# Patient Record
Sex: Female | Born: 1955
Health system: Southern US, Community
[De-identification: ages and names within clinical notes are randomized; demographics above are authoritative.]

## PROBLEM LIST (undated history)

## (undated) DIAGNOSIS — Z95818 Presence of other cardiac implants and grafts: Secondary | ICD-10-CM

## (undated) DIAGNOSIS — Z95810 Presence of automatic (implantable) cardiac defibrillator: Secondary | ICD-10-CM

## (undated) DIAGNOSIS — E039 Hypothyroidism, unspecified: Secondary | ICD-10-CM

## (undated) DIAGNOSIS — I428 Other cardiomyopathies: Secondary | ICD-10-CM

## (undated) DIAGNOSIS — E119 Type 2 diabetes mellitus without complications: Secondary | ICD-10-CM

## (undated) DIAGNOSIS — I4729 Other ventricular tachycardia: Secondary | ICD-10-CM

## (undated) DIAGNOSIS — E785 Hyperlipidemia, unspecified: Secondary | ICD-10-CM

## (undated) DIAGNOSIS — I5022 Chronic systolic (congestive) heart failure: Secondary | ICD-10-CM

## (undated) DIAGNOSIS — Q211 Atrial septal defect, unspecified: Secondary | ICD-10-CM

## (undated) DIAGNOSIS — I10 Essential (primary) hypertension: Secondary | ICD-10-CM

## (undated) DIAGNOSIS — G4733 Obstructive sleep apnea (adult) (pediatric): Secondary | ICD-10-CM

## (undated) DIAGNOSIS — I340 Nonrheumatic mitral (valve) insufficiency: Secondary | ICD-10-CM

## (undated) DIAGNOSIS — N183 Chronic kidney disease, stage 3 unspecified: Secondary | ICD-10-CM

## (undated) DIAGNOSIS — E669 Obesity, unspecified: Secondary | ICD-10-CM

## (undated) DIAGNOSIS — Z9889 Other specified postprocedural states: Secondary | ICD-10-CM

## (undated) DIAGNOSIS — I472 Ventricular tachycardia: Secondary | ICD-10-CM

## (undated) HISTORY — PX: OTHER SURGICAL HISTORY: SHX169

## (undated) HISTORY — PX: ICD IMPLANT: EP1208

---

## 2019-01-15 ENCOUNTER — Inpatient Hospital Stay (HOSPITAL_COMMUNITY)
Admission: EM | Admit: 2019-01-15 | Discharge: 2019-01-20 | DRG: 871 | Disposition: A | Discharge status: Home / Self Care | Payer: BLUE CROSS/BLUE SHIELD | Attending: Internal Medicine | Admitting: Internal Medicine

## 2019-01-15 ENCOUNTER — Emergency Department (HOSPITAL_COMMUNITY): Payer: BLUE CROSS/BLUE SHIELD

## 2019-01-15 ENCOUNTER — Encounter (HOSPITAL_COMMUNITY): Payer: Self-pay | Admitting: Pulmonary Disease

## 2019-01-15 DIAGNOSIS — I452 Bifascicular block: Secondary | ICD-10-CM | POA: Diagnosis present

## 2019-01-15 DIAGNOSIS — E870 Hyperosmolality and hypernatremia: Secondary | ICD-10-CM | POA: Diagnosis present

## 2019-01-15 DIAGNOSIS — Y95 Nosocomial condition: Secondary | ICD-10-CM | POA: Diagnosis present

## 2019-01-15 DIAGNOSIS — I472 Ventricular tachycardia: Secondary | ICD-10-CM | POA: Diagnosis present

## 2019-01-15 DIAGNOSIS — I272 Pulmonary hypertension, unspecified: Secondary | ICD-10-CM | POA: Diagnosis present

## 2019-01-15 DIAGNOSIS — I130 Hypertensive heart and chronic kidney disease with heart failure and stage 1 through stage 4 chronic kidney disease, or unspecified chronic kidney disease: Secondary | ICD-10-CM | POA: Diagnosis present

## 2019-01-15 DIAGNOSIS — R0902 Hypoxemia: Secondary | ICD-10-CM

## 2019-01-15 DIAGNOSIS — N179 Acute kidney failure, unspecified: Secondary | ICD-10-CM | POA: Diagnosis present

## 2019-01-15 DIAGNOSIS — I428 Other cardiomyopathies: Secondary | ICD-10-CM | POA: Diagnosis present

## 2019-01-15 DIAGNOSIS — I447 Left bundle-branch block, unspecified: Secondary | ICD-10-CM | POA: Diagnosis present

## 2019-01-15 DIAGNOSIS — R509 Fever, unspecified: Secondary | ICD-10-CM | POA: Diagnosis not present

## 2019-01-15 DIAGNOSIS — Z6829 Body mass index (BMI) 29.0-29.9, adult: Secondary | ICD-10-CM

## 2019-01-15 DIAGNOSIS — I509 Heart failure, unspecified: Secondary | ICD-10-CM

## 2019-01-15 DIAGNOSIS — J9621 Acute and chronic respiratory failure with hypoxia: Secondary | ICD-10-CM

## 2019-01-15 DIAGNOSIS — E876 Hypokalemia: Secondary | ICD-10-CM | POA: Diagnosis present

## 2019-01-15 DIAGNOSIS — A419 Sepsis, unspecified organism: Secondary | ICD-10-CM | POA: Diagnosis not present

## 2019-01-15 DIAGNOSIS — I959 Hypotension, unspecified: Secondary | ICD-10-CM | POA: Diagnosis present

## 2019-01-15 DIAGNOSIS — J189 Pneumonia, unspecified organism: Secondary | ICD-10-CM | POA: Diagnosis present

## 2019-01-15 DIAGNOSIS — Z7982 Long term (current) use of aspirin: Secondary | ICD-10-CM

## 2019-01-15 DIAGNOSIS — Z90710 Acquired absence of both cervix and uterus: Secondary | ICD-10-CM

## 2019-01-15 DIAGNOSIS — E785 Hyperlipidemia, unspecified: Secondary | ICD-10-CM | POA: Diagnosis present

## 2019-01-15 DIAGNOSIS — Z7902 Long term (current) use of antithrombotics/antiplatelets: Secondary | ICD-10-CM

## 2019-01-15 DIAGNOSIS — G9340 Encephalopathy, unspecified: Secondary | ICD-10-CM

## 2019-01-15 DIAGNOSIS — Z9114 Patient's other noncompliance with medication regimen: Secondary | ICD-10-CM

## 2019-01-15 DIAGNOSIS — E039 Hypothyroidism, unspecified: Secondary | ICD-10-CM | POA: Diagnosis present

## 2019-01-15 DIAGNOSIS — J9601 Acute respiratory failure with hypoxia: Secondary | ICD-10-CM | POA: Diagnosis present

## 2019-01-15 DIAGNOSIS — Z95810 Presence of automatic (implantable) cardiac defibrillator: Secondary | ICD-10-CM

## 2019-01-15 DIAGNOSIS — Z1159 Encounter for screening for other viral diseases: Secondary | ICD-10-CM | POA: Diagnosis not present

## 2019-01-15 DIAGNOSIS — N183 Chronic kidney disease, stage 3 unspecified: Secondary | ICD-10-CM

## 2019-01-15 DIAGNOSIS — Z794 Long term (current) use of insulin: Secondary | ICD-10-CM | POA: Diagnosis not present

## 2019-01-15 DIAGNOSIS — Z79899 Other long term (current) drug therapy: Secondary | ICD-10-CM

## 2019-01-15 DIAGNOSIS — I493 Ventricular premature depolarization: Secondary | ICD-10-CM | POA: Diagnosis present

## 2019-01-15 DIAGNOSIS — E1122 Type 2 diabetes mellitus with diabetic chronic kidney disease: Secondary | ICD-10-CM | POA: Diagnosis present

## 2019-01-15 DIAGNOSIS — I083 Combined rheumatic disorders of mitral, aortic and tricuspid valves: Secondary | ICD-10-CM | POA: Diagnosis present

## 2019-01-15 DIAGNOSIS — Z8249 Family history of ischemic heart disease and other diseases of the circulatory system: Secondary | ICD-10-CM

## 2019-01-15 DIAGNOSIS — G4733 Obstructive sleep apnea (adult) (pediatric): Secondary | ICD-10-CM | POA: Diagnosis present

## 2019-01-15 DIAGNOSIS — Q211 Atrial septal defect: Secondary | ICD-10-CM | POA: Diagnosis not present

## 2019-01-15 DIAGNOSIS — I5023 Acute on chronic systolic (congestive) heart failure: Secondary | ICD-10-CM | POA: Diagnosis present

## 2019-01-15 DIAGNOSIS — N184 Chronic kidney disease, stage 4 (severe): Secondary | ICD-10-CM | POA: Diagnosis present

## 2019-01-15 DIAGNOSIS — Z841 Family history of disorders of kidney and ureter: Secondary | ICD-10-CM

## 2019-01-15 DIAGNOSIS — E669 Obesity, unspecified: Secondary | ICD-10-CM | POA: Diagnosis present

## 2019-01-15 DIAGNOSIS — R570 Cardiogenic shock: Secondary | ICD-10-CM | POA: Diagnosis present

## 2019-01-15 DIAGNOSIS — R0602 Shortness of breath: Secondary | ICD-10-CM

## 2019-01-15 DIAGNOSIS — J810 Acute pulmonary edema: Secondary | ICD-10-CM

## 2019-01-15 DIAGNOSIS — Z833 Family history of diabetes mellitus: Secondary | ICD-10-CM

## 2019-01-15 DIAGNOSIS — Z9289 Personal history of other medical treatment: Secondary | ICD-10-CM

## 2019-01-15 DIAGNOSIS — Z6830 Body mass index (BMI) 30.0-30.9, adult: Secondary | ICD-10-CM

## 2019-01-15 DIAGNOSIS — R6521 Severe sepsis with septic shock: Secondary | ICD-10-CM | POA: Diagnosis present

## 2019-01-15 HISTORY — DX: Nonrheumatic mitral (valve) insufficiency: I34.0

## 2019-01-15 HISTORY — DX: Other cardiomyopathies: I42.8

## 2019-01-15 HISTORY — DX: Atrial septal defect, unspecified: Q21.10

## 2019-01-15 HISTORY — DX: Presence of automatic (implantable) cardiac defibrillator: Z95.810

## 2019-01-15 HISTORY — DX: Chronic systolic (congestive) heart failure: I50.22

## 2019-01-15 HISTORY — DX: Other specified postprocedural states: Z98.890

## 2019-01-15 HISTORY — DX: Hypothyroidism, unspecified: E03.9

## 2019-01-15 HISTORY — DX: Essential (primary) hypertension: I10

## 2019-01-15 HISTORY — DX: Obstructive sleep apnea (adult) (pediatric): G47.33

## 2019-01-15 HISTORY — DX: Type 2 diabetes mellitus without complications: E11.9

## 2019-01-15 HISTORY — DX: Ventricular tachycardia: I47.2

## 2019-01-15 HISTORY — DX: Atrial septal defect: Q21.1

## 2019-01-15 HISTORY — DX: Other ventricular tachycardia: I47.29

## 2019-01-15 HISTORY — DX: Chronic kidney disease, stage 3 unspecified: N18.30

## 2019-01-15 HISTORY — DX: Obesity, unspecified: E66.9

## 2019-01-15 HISTORY — DX: Presence of other cardiac implants and grafts: Z95.818

## 2019-01-15 HISTORY — DX: Hyperlipidemia, unspecified: E78.5

## 2019-01-15 LAB — POCT I-STAT 7, (LYTES, BLD GAS, ICA,H+H)
Acid-Base Excess: 2 mmol/L (ref 0.0–2.0)
Bicarbonate: 26.7 mmol/L (ref 20.0–28.0)
Bicarbonate: 28.1 mmol/L — ABNORMAL HIGH (ref 20.0–28.0)
Calcium, Ion: 1.28 mmol/L (ref 1.15–1.40)
Calcium, Ion: 1.28 mmol/L (ref 1.15–1.40)
HCT: 42 % (ref 36.0–46.0)
HCT: 43 % (ref 36.0–46.0)
Hemoglobin: 14.3 g/dL (ref 12.0–15.0)
Hemoglobin: 14.6 g/dL (ref 12.0–15.0)
O2 Saturation: 96 %
O2 Saturation: 99 %
Potassium: 3.7 mmol/L (ref 3.5–5.1)
Potassium: 3.7 mmol/L (ref 3.5–5.1)
Sodium: 147 mmol/L — ABNORMAL HIGH (ref 135–145)
Sodium: 149 mmol/L — ABNORMAL HIGH (ref 135–145)
TCO2: 28 mmol/L (ref 22–32)
TCO2: 30 mmol/L (ref 22–32)
pCO2 arterial: 50.2 mmHg — ABNORMAL HIGH (ref 32.0–48.0)
pCO2 arterial: 46.9 mmHg (ref 32.0–48.0)
pH, Arterial: 7.334 — ABNORMAL LOW (ref 7.350–7.450)
pH, Arterial: 7.386 (ref 7.350–7.450)
pO2, Arterial: 87 mmHg (ref 83.0–108.0)
pO2, Arterial: 163 mmHg — ABNORMAL HIGH (ref 83.0–108.0)

## 2019-01-15 LAB — CBC WITH DIFFERENTIAL/PLATELET
Abs Immature Granulocytes: 0.03 10*3/uL (ref 0.00–0.07)
Basophils Absolute: 0 10*3/uL (ref 0.0–0.1)
Basophils Relative: 0 %
Eosinophils Absolute: 0.2 10*3/uL (ref 0.0–0.5)
Eosinophils Relative: 2 %
HCT: 48 % — ABNORMAL HIGH (ref 36.0–46.0)
Hemoglobin: 13.8 g/dL (ref 12.0–15.0)
Immature Granulocytes: 0 %
Lymphocytes Relative: 36 %
Lymphs Abs: 3 10*3/uL (ref 0.7–4.0)
MCH: 26.4 pg (ref 26.0–34.0)
MCHC: 28.8 g/dL — ABNORMAL LOW (ref 30.0–36.0)
MCV: 92 fL (ref 80.0–100.0)
Monocytes Absolute: 0.5 10*3/uL (ref 0.1–1.0)
Monocytes Relative: 6 %
Neutro Abs: 4.5 10*3/uL (ref 1.7–7.7)
Neutrophils Relative %: 56 %
Platelets: 136 10*3/uL — ABNORMAL LOW (ref 150–400)
RBC: 5.22 MIL/uL — ABNORMAL HIGH (ref 3.87–5.11)
RDW: 17 % — ABNORMAL HIGH (ref 11.5–15.5)
WBC: 8.1 10*3/uL (ref 4.0–10.5)
nRBC: 0 % (ref 0.0–0.2)

## 2019-01-15 LAB — URINALYSIS, ROUTINE W REFLEX MICROSCOPIC
Bilirubin Urine: NEGATIVE
Glucose, UA: 150 mg/dL — AB
Hgb urine dipstick: NEGATIVE
Ketones, ur: NEGATIVE mg/dL
Leukocytes,Ua: NEGATIVE
Nitrite: NEGATIVE
Protein, ur: >=300 mg/dL — AB
Specific Gravity, Urine: 1.018 (ref 1.005–1.030)
pH: 6 (ref 5.0–8.0)

## 2019-01-15 LAB — MAGNESIUM: Magnesium: 2.3 mg/dL (ref 1.7–2.4)

## 2019-01-15 LAB — BASIC METABOLIC PANEL
Anion gap: 19 — ABNORMAL HIGH (ref 5–15)
BUN: 33 mg/dL — ABNORMAL HIGH (ref 8–23)
CO2: 18 mmol/L — ABNORMAL LOW (ref 22–32)
Calcium: 9 mg/dL (ref 8.9–10.3)
Chloride: 108 mmol/L (ref 98–111)
Creatinine, Ser: 2.36 mg/dL — ABNORMAL HIGH (ref 0.44–1.00)
GFR calc Af Amer: 24 mL/min — ABNORMAL LOW (ref >60)
GFR calc non Af Amer: 21 mL/min — ABNORMAL LOW (ref >60)
Glucose, Bld: 279 mg/dL — ABNORMAL HIGH (ref 70–99)
Potassium: 4.6 mmol/L (ref 3.5–5.1)
Sodium: 145 mmol/L (ref 135–145)

## 2019-01-15 LAB — PROCALCITONIN: Procalcitonin: 13.9 ng/mL

## 2019-01-15 LAB — LACTIC ACID, PLASMA
Lactic Acid, Venous: 6.6 mmol/L (ref 0.5–1.9)
Lactic Acid, Venous: 1.2 mmol/L (ref 0.5–1.9)

## 2019-01-15 LAB — GLUCOSE, CAPILLARY
Glucose-Capillary: 101 mg/dL — ABNORMAL HIGH (ref 70–99)
Glucose-Capillary: 116 mg/dL — ABNORMAL HIGH (ref 70–99)
Glucose-Capillary: 116 mg/dL — ABNORMAL HIGH (ref 70–99)
Glucose-Capillary: 80 mg/dL (ref 70–99)
Glucose-Capillary: 97 mg/dL (ref 70–99)

## 2019-01-15 LAB — SARS CORONAVIRUS 2 BY RT PCR (HOSPITAL ORDER, PERFORMED IN ~~LOC~~ HOSPITAL LAB): SARS Coronavirus 2: NEGATIVE

## 2019-01-15 LAB — TRIGLYCERIDES: Triglycerides: 96 mg/dL (ref <150)

## 2019-01-15 LAB — PHOSPHORUS: Phosphorus: 4.5 mg/dL (ref 2.5–4.6)

## 2019-01-15 LAB — COOXEMETRY PANEL
Carboxyhemoglobin: 1.3 % (ref 0.5–1.5)
Methemoglobin: 1.2 % (ref 0.0–1.5)
O2 Saturation: 60.9 %
Total hemoglobin: 13.9 g/dL (ref 12.0–16.0)

## 2019-01-15 LAB — HEMOGLOBIN A1C
Hgb A1c MFr Bld: 5 % (ref 4.8–5.6)
Mean Plasma Glucose: 96.8 mg/dL

## 2019-01-15 LAB — MRSA PCR SCREENING: MRSA by PCR: NEGATIVE

## 2019-01-15 LAB — BRAIN NATRIURETIC PEPTIDE: B Natriuretic Peptide: 595.2 pg/mL — ABNORMAL HIGH (ref 0.0–100.0)

## 2019-01-15 LAB — TROPONIN I (HIGH SENSITIVITY)
Troponin I (High Sensitivity): 18 ng/L — ABNORMAL HIGH (ref <18)
Troponin I (High Sensitivity): 48 ng/L — ABNORMAL HIGH (ref <18)

## 2019-01-15 LAB — TSH: TSH: 0.141 u[IU]/mL — ABNORMAL LOW (ref 0.350–4.500)

## 2019-01-15 MED ORDER — CHLORHEXIDINE GLUCONATE 0.12% ORAL RINSE (MEDLINE KIT)
15.0000 mL | Freq: Two times a day (BID) | OROMUCOSAL | Status: DC
  Administered 2019-01-15 – 2019-01-17 (×4): 15 mL via OROMUCOSAL

## 2019-01-15 MED ORDER — AMIODARONE HCL 200 MG PO TABS: 200.0000 mg | ORAL_TABLET | Freq: Every day | ORAL | Status: DC

## 2019-01-15 MED ORDER — FENTANYL CITRATE (PF) 100 MCG/2ML IJ SOLN
INTRAMUSCULAR | Status: AC
  Filled 2019-01-15: qty 2

## 2019-01-15 MED ORDER — HEPARIN SODIUM (PORCINE) 5000 UNIT/ML IJ SOLN
5000.0000 [IU] | Freq: Three times a day (TID) | INTRAMUSCULAR | Status: DC
  Administered 2019-01-15 – 2019-01-20 (×14): 5000 [IU] via SUBCUTANEOUS
  Filled 2019-01-15 (×14): qty 1

## 2019-01-15 MED ORDER — NOREPINEPHRINE 16 MG/250ML-% IV SOLN
2.0000 ug/min | INTRAVENOUS | Status: DC
  Administered 2019-01-15: 10 ug/min via INTRAVENOUS
  Filled 2019-01-15: qty 250

## 2019-01-15 MED ORDER — ORAL CARE MOUTH RINSE
15.0000 mL | OROMUCOSAL | Status: DC
  Administered 2019-01-15 – 2019-01-17 (×15): 15 mL via OROMUCOSAL

## 2019-01-15 MED ORDER — FUROSEMIDE 10 MG/ML IJ SOLN
120.0000 mg | Freq: Two times a day (BID) | INTRAVENOUS | Status: DC
  Administered 2019-01-15: 120 mg via INTRAVENOUS
  Filled 2019-01-15 (×3): qty 12
  Filled 2019-01-15: qty 10
  Filled 2019-01-15 (×3): qty 12

## 2019-01-15 MED ORDER — DOBUTAMINE IN D5W 4-5 MG/ML-% IV SOLN
2.5000 ug/kg/min | INTRAVENOUS | Status: DC
  Administered 2019-01-15: 2.5 ug/kg/min via INTRAVENOUS
  Filled 2019-01-15: qty 250

## 2019-01-15 MED ORDER — FENTANYL CITRATE (PF) 100 MCG/2ML IJ SOLN
50.0000 ug | INTRAMUSCULAR | Status: DC | PRN
  Administered 2019-01-15: 50 ug via INTRAVENOUS

## 2019-01-15 MED ORDER — AMIODARONE HCL 200 MG PO TABS
200.0000 mg | ORAL_TABLET | Freq: Every day | ORAL | Status: DC
  Administered 2019-01-15 – 2019-01-17 (×3): 200 mg
  Filled 2019-01-15 (×3): qty 1

## 2019-01-15 MED ORDER — ASPIRIN 81 MG PO CHEW
81.0000 mg | CHEWABLE_TABLET | Freq: Every day | ORAL | Status: DC
  Administered 2019-01-15 – 2019-01-17 (×3): 81 mg
  Filled 2019-01-15 (×3): qty 1

## 2019-01-15 MED ORDER — LEVOTHYROXINE SODIUM 100 MCG/5ML IV SOLN
50.0000 ug | Freq: Every day | INTRAVENOUS | Status: DC
  Administered 2019-01-16 – 2019-01-17 (×2): 50 ug via INTRAVENOUS
  Filled 2019-01-15 (×2): qty 5

## 2019-01-15 MED ORDER — FENTANYL CITRATE (PF) 100 MCG/2ML IJ SOLN
INTRAMUSCULAR | Status: AC | PRN
  Administered 2019-01-15: 50 ug via INTRAVENOUS

## 2019-01-15 MED ORDER — MILRINONE LACTATE IN DEXTROSE 20-5 MG/100ML-% IV SOLN
0.2500 ug/kg/min | INTRAVENOUS | Status: DC
  Administered 2019-01-15 (×2): 0.25 ug/kg/min via INTRAVENOUS
  Filled 2019-01-15 (×2): qty 100

## 2019-01-15 MED ORDER — VANCOMYCIN HCL 10 G IV SOLR
1250.0000 mg | INTRAVENOUS | Status: DC
  Administered 2019-01-16 – 2019-01-17 (×3): 1250 mg via INTRAVENOUS
  Filled 2019-01-15 (×3): qty 1250

## 2019-01-15 MED ORDER — SUCCINYLCHOLINE CHLORIDE 20 MG/ML IJ SOLN
INTRAMUSCULAR | Status: AC | PRN
  Administered 2019-01-15: 100 mg via INTRAVENOUS

## 2019-01-15 MED ORDER — CLOPIDOGREL BISULFATE 75 MG PO TABS
75.0000 mg | ORAL_TABLET | Freq: Every day | ORAL | Status: DC
  Administered 2019-01-15 – 2019-01-17 (×3): 75 mg
  Filled 2019-01-15 (×3): qty 1

## 2019-01-15 MED ORDER — INSULIN ASPART 100 UNIT/ML ~~LOC~~ SOLN
0.0000 [IU] | SUBCUTANEOUS | Status: DC
  Administered 2019-01-16 – 2019-01-17 (×3): 2 [IU] via SUBCUTANEOUS

## 2019-01-15 MED ORDER — FUROSEMIDE 10 MG/ML IJ SOLN
120.0000 mg | Freq: Two times a day (BID) | INTRAVENOUS | Status: DC
  Administered 2019-01-16: 120 mg via INTRAVENOUS
  Filled 2019-01-15 (×3): qty 12

## 2019-01-15 MED ORDER — FENTANYL CITRATE (PF) 100 MCG/2ML IJ SOLN
50.0000 ug | INTRAMUSCULAR | Status: DC | PRN
  Filled 2019-01-15: qty 2

## 2019-01-15 MED ORDER — HYDRALAZINE HCL 20 MG/ML IJ SOLN: 10.0000 mg | INTRAMUSCULAR | Status: DC | PRN

## 2019-01-15 MED ORDER — ETOMIDATE 2 MG/ML IV SOLN
INTRAVENOUS | Status: AC | PRN
  Administered 2019-01-15: 20 mg via INTRAVENOUS

## 2019-01-15 MED ORDER — SODIUM CHLORIDE 0.9 % IV SOLN
2.0000 g | INTRAVENOUS | Status: DC
  Administered 2019-01-15 – 2019-01-19 (×4): 2 g via INTRAVENOUS
  Filled 2019-01-15 (×5): qty 2

## 2019-01-15 MED ORDER — PROPOFOL 1000 MG/100ML IV EMUL
0.0000 ug/kg/min | INTRAVENOUS | Status: DC
  Administered 2019-01-15: 20 ug/kg/min via INTRAVENOUS
  Administered 2019-01-15: 5 ug/kg/min via INTRAVENOUS
  Filled 2019-01-15: qty 100

## 2019-01-15 MED ORDER — PANTOPRAZOLE SODIUM 40 MG IV SOLR
40.0000 mg | Freq: Every day | INTRAVENOUS | Status: DC
  Administered 2019-01-15 – 2019-01-16 (×2): 40 mg via INTRAVENOUS
  Filled 2019-01-15 (×2): qty 40

## 2019-01-15 MED ORDER — PROPOFOL 1000 MG/100ML IV EMUL
INTRAVENOUS | Status: AC
  Filled 2019-01-15: qty 100

## 2019-01-15 MED ORDER — ACETAMINOPHEN 160 MG/5ML PO SOLN
650.0000 mg | Freq: Four times a day (QID) | ORAL | Status: DC | PRN
  Administered 2019-01-15 – 2019-01-17 (×5): 650 mg
  Filled 2019-01-15 (×5): qty 20.3

## 2019-01-15 MED ORDER — NOREPINEPHRINE 4 MG/250ML-% IV SOLN
2.0000 ug/min | INTRAVENOUS | Status: DC
  Administered 2019-01-15: 17:00:00 4 ug/min via INTRAVENOUS

## 2019-01-15 NOTE — ED Notes (Signed)
Attempted to collect labs without success  

## 2019-01-15 NOTE — ED Notes (Signed)
X-ray at bedside

## 2019-01-15 NOTE — Progress Notes (Signed)
PCCM INTERVAL PROGRESS NOTE  Called for new fever.  Temp 102.5.  WBC wnl No historical complaints of infectious symptoms.   Plan: Start PRN tylenol for fevers Pan culture Low threshold to start ABX if fever remains at next temp check.     Georgann Housekeeper, AGACNP-BC [place] Pager [phone] or [phone]  01/15/2019 6:31 PM

## 2019-01-15 NOTE — Progress Notes (Signed)
  Remains on vent. Awake.   Earlier this evening spiked temp to 39.9. Seen by CCM and started tylenol and cx drawn ABx not started.   BP has dropped subsequently and dobutamine added. Now on milrinone 0.25, dobutamine 2.5 and NE 16.   SBP 100-105. Temp now 38.4 with ice packs and tylenol.   Co-ox 61% Lactate 6.6 -> 1.2  WBC on admit 8.1 with no left shift.   CVP measured personally and although tracing not great seems to consistently read 29-30.  Will give IV lasix. Stop milrinone. Continue dobutamine and NE.   Check stat PCT. If PCT elevated or has recurrent fever will start broad spectrum abx.   40 mins additional CCT  Glori Bickers, MD  9:58 PM

## 2019-01-15 NOTE — ED Notes (Signed)
ED TO INPATIENT HANDOFF REPORT  ED Nurse Name and Phone #:  Rommel Hogston [phone]  S Name/Age/Gender Veronica Burke 63 y.o. female Room/Bed: TRAAC/TRAAC  Code Status   Code Status: Full Code  Home/SNF/Other Home Patient oriented to: self, place, time and situation Is this baseline? Yes   Triage Complete: Triage complete  Chief Complaint Resp Distress  Triage Note Pt arrives via EMS for resp distress. Pt riding in car back from [place], feeling progressively SOB. EMS arrived and pt had bilateral rales. When they had pt stand, pt became unresponsive, brady in the 40's. EMS started bagging pt for 5-6 min. HR improved, pt placed on CPAP and remained until arrival at ED. Pt had PICC line with CHF medication in right arm. BP 132/110, HR 100, spo2 85% per EMS   Allergies No Known Allergies  Level of Care/Admitting Diagnosis ED Disposition    ED Disposition Condition [place]: [facility] [[id number]]  Level of Care: ICU [6]  Covid Evaluation: Confirmed COVID Negative  Diagnosis: Acute on chronic HFrEF (heart failure with reduced ejection fraction) [facility]) [[id number]]  Admitting Physician: Lady Saucier  Attending Physician: Rush Farmer [phone]  Estimated length of stay: 5 - 7 days  Certification:: I certify this patient will need inpatient services for at least 2 midnights  PT Class (Do Not Modify): Inpatient [101]  PT Acc Code (Do Not Modify): Private [1]       B Medical/Surgery History Past Medical History:  Diagnosis Date  . CHF (congestive heart failure) (HCC)    home milirnone infusion  . CKD (chronic kidney disease)   . DM (diabetes mellitus) ([place])   . Dyslipidemia   . Hypothyroid   . Mitral regurgitation    s/p clip 12/2018  . OSA (obstructive sleep apnea)    on CPAP   History reviewed. No pertinent surgical history.   A IV Location/Drains/Wounds Patient Lines/Drains/Airways Status   Active Line/Drains/Airways     Name:   Placement date:   Placement time:   Site:   Days:   Peripheral IV 01/15/19 Left Wrist   01/15/19    1007    Wrist   less than 1   Peripheral IV 01/15/19 Left Antecubital   01/15/19    1023    Antecubital   less than 1   NG/OG Tube Orogastric 18 Fr. Center mouth Xray;Aucultation   01/15/19    1012    Center mouth   less than 1   Urethral Catheter Hope RN Temperature probe;Straight-tip 14 Fr.   01/15/19    1045    Temperature probe;Straight-tip   less than 1   Airway 7.5 mm   01/15/19    1020     less than 1          Intake/Output Last 24 hours No intake or output data in the 24 hours ending 01/15/19 1300  Labs/Imaging Results for orders placed or performed during the hospital encounter of 01/15/19 (from the past 48 hour(s))  SARS Coronavirus 2 (CEPHEID - Performed in [facility] hospital lab), Hosp Order     Status: None   Collection Time: 01/15/19 10:06 AM   Specimen: Nasopharyngeal Swab  Result Value Ref Range   SARS Coronavirus 2 NEGATIVE NEGATIVE    Comment: (NOTE) If result is NEGATIVE SARS-CoV-2 target nucleic acids are NOT DETECTED. The SARS-CoV-2 RNA is generally detectable in upper and lower  respiratory specimens during the acute phase of infection.  The lowest  concentration of SARS-CoV-2 viral copies this assay can detect is 250  copies / mL. A negative result does not preclude SARS-CoV-2 infection  and should not be used as the sole basis for treatment or other  patient management decisions.  A negative result may occur with  improper specimen collection / handling, submission of specimen other  than nasopharyngeal swab, presence of viral mutation(s) within the  areas targeted by this assay, and inadequate number of viral copies  (<250 copies / mL). A negative result must be combined with clinical  observations, patient history, and epidemiological information. If result is POSITIVE SARS-CoV-2 target nucleic acids are DETECTED. The SARS-CoV-2 RNA is generally  detectable in upper and lower  respiratory specimens dur ing the acute phase of infection.  Positive  results are indicative of active infection with SARS-CoV-2.  Clinical  correlation with patient history and other diagnostic information is  necessary to determine patient infection status.  Positive results do  not rule out bacterial infection or co-infection with other viruses. If result is PRESUMPTIVE POSTIVE SARS-CoV-2 nucleic acids MAY BE PRESENT.   A presumptive positive result was obtained on the submitted specimen  and confirmed on repeat testing.  While 2019 novel coronavirus  (SARS-CoV-2) nucleic acids may be present in the submitted sample  additional confirmatory testing may be necessary for epidemiological  and / or clinical management purposes  to differentiate between  SARS-CoV-2 and other Sarbecovirus currently known to infect humans.  If clinically indicated additional testing with an alternate test  methodology [phone]) is advised. The SARS-CoV-2 RNA is generally  detectable in upper and lower respiratory sp ecimens during the acute  phase of infection. The expected result is Negative. Fact Sheet for Patients:  StrictlyIdeas.no Fact Sheet for Healthcare Providers: BankingDealers.co.za This test is not yet approved or cleared by the [place] FDA and has been authorized for detection and/or diagnosis of SARS-CoV-2 by FDA under an Emergency Use Authorization (EUA).  This EUA will remain in effect (meaning this test can be used) for the duration of the COVID-19 declaration under Section 564(b)(1) of the Act, 21 U.S.C. section 360bbb-3(b)(1), unless the authorization is terminated or revoked sooner. Performed at [facility], [place] [address], [place], [place] [address]   Basic metabolic panel     Status: Abnormal   Collection Time: 01/15/19 10:13 AM  Result Value Ref Range   Sodium 145 135 - 145 mmol/L    Potassium 4.6 3.5 - 5.1 mmol/L    Comment: SLIGHT HEMOLYSIS   Chloride 108 98 - 111 mmol/L   CO2 18 (L) 22 - 32 mmol/L   Glucose, Bld 279 (H) 70 - 99 mg/dL   BUN 33 (H) 8 - 23 mg/dL   Creatinine, Ser 2.36 (H) 0.44 - 1.00 mg/dL   Calcium 9.0 8.9 - 10.3 mg/dL   GFR calc non Af Amer 21 (L) >60 mL/min   GFR calc Af Amer 24 (L) >60 mL/min   Anion gap 19 (H) 5 - 15    Comment: Performed at [facility], [place] [address], [place], [place] [address]  CBC with Differential     Status: Abnormal   Collection Time: 01/15/19 10:13 AM  Result Value Ref Range   WBC 8.1 4.0 - 10.5 K/uL   RBC 5.22 (H) 3.87 - 5.11 MIL/uL   Hemoglobin 13.8 12.0 - 15.0 g/dL   HCT 48.0 (H) 36.0 - 46.0 %   MCV 92.0 80.0 - 100.0 fL  MCH 26.4 26.0 - 34.0 pg   MCHC 28.8 (L) 30.0 - 36.0 g/dL   RDW 17.0 (H) 11.5 - 15.5 %   Platelets 136 (L) 150 - 400 K/uL   nRBC 0.0 0.0 - 0.2 %   Neutrophils Relative % 56 %   Neutro Abs 4.5 1.7 - 7.7 K/uL   Lymphocytes Relative 36 %   Lymphs Abs 3.0 0.7 - 4.0 K/uL   Monocytes Relative 6 %   Monocytes Absolute 0.5 0.1 - 1.0 K/uL   Eosinophils Relative 2 %   Eosinophils Absolute 0.2 0.0 - 0.5 K/uL   Basophils Relative 0 %   Basophils Absolute 0.0 0.0 - 0.1 K/uL   Immature Granulocytes 0 %   Abs Immature Granulocytes 0.03 0.00 - 0.07 K/uL    Comment: Performed at [facility] [address] [place], [place] [address]  Brain natriuretic peptide     Status: Abnormal   Collection Time: 01/15/19 10:13 AM  Result Value Ref Range   B Natriuretic Peptide 595.2 (H) 0.0 - 100.0 pg/mL    Comment: Performed at [facility] [address] [place], [place] [address]  Troponin I (High Sensitivity)     Status: Abnormal   Collection Time: 01/15/19 10:13 AM  Result Value Ref Range   Troponin I (High Sensitivity) 18 (H) <18 ng/L    Comment: (NOTE) Elevated high sensitivity troponin I (hsTnI) values and significant  changes across serial measurements may suggest ACS but many  other  chronic and acute conditions are known to elevate hsTnI results.  Refer to the Links section for chest pain algorithms and additional  guidance. Performed at [facility], [place] [address], [place], [place] [address]   Lactic acid, plasma     Status: Abnormal   Collection Time: 01/15/19 10:14 AM  Result Value Ref Range   Lactic Acid, Venous 6.6 (HH) 0.5 - 1.9 mmol/L    Comment: CRITICAL RESULT CALLED TO, READ BACK BY AND VERIFIED WITH: H.DOOLEY,RN 1047 01/15/2019 CLARK,S Performed at [facility], [place] [address] [place], [place] [address]   Triglycerides     Status: None   Collection Time: 01/15/19 10:14 AM  Result Value Ref Range   Triglycerides 96 <150 mg/dL    Comment: SLIGHT HEMOLYSIS Performed at [facility] [address], [place], [place] [address]   I-STAT 7, (LYTES, BLD GAS, ICA, H+H)     Status: Abnormal   Collection Time: 01/15/19 11:34 AM  Result Value Ref Range   pH, Arterial 7.334 (L) 7.350 - 7.450   pCO2 arterial 50.2 (H) 32.0 - 48.0 mmHg   pO2, Arterial 87.0 83.0 - 108.0 mmHg   Bicarbonate 26.7 20.0 - 28.0 mmol/L   TCO2 28 22 - 32 mmol/L   O2 Saturation 96.0 %   Sodium 147 (H) 135 - 145 mmol/L   Potassium 3.7 3.5 - 5.1 mmol/L   Calcium, Ion 1.28 1.15 - 1.40 mmol/L   HCT 42.0 36.0 - 46.0 %   Hemoglobin 14.3 12.0 - 15.0 g/dL   Patient temperature HIDE    Sample type ARTERIAL    Dg Chest Port 1 View  Result Date: 01/15/2019 CLINICAL DATA:  Endotracheal and orogastric tube placement. EXAM: PORTABLE CHEST 1 VIEW COMPARISON:  Chest CT June 29, 2018 FINDINGS: Endotracheal tube terminates 4 cm above the carina. Enteric catheter transverses the thorax and descends into the expected location of gastric body, tip collimated off the image. Single lead  cardiac pacemaker in place. The cardiac silhouette is enlarged. Mediastinal contours appear intact. Bilateral patchy alveolar and interstitial opacities with central predominance. No evidence of  pneumothorax. Osseous structures are without acute abnormality. Soft tissues are grossly normal. IMPRESSION: 1. Support apparatus as described. 2. Bilateral patchy alveolar and interstitial opacities with central predominance. This may represent mixed pattern pulmonary edema or bilateral airspace consolidation. 3. Enlarged cardiac silhouette. Electronically Signed   By: Fidela Salisbury M.D.   On: 01/15/2019 10:40    Pending Labs Unresulted Labs (From admission, onward)    Start     Ordered   01/16/19 0500  CBC  Tomorrow morning,   R     01/15/19 1244   01/16/19 1941  Basic metabolic panel  Tomorrow morning,   R     01/15/19 1244   01/16/19 0500  Magnesium  Tomorrow morning,   R     01/15/19 1244   01/16/19 0500  Phosphorus  Tomorrow morning,   R     01/15/19 1244   01/16/19 0500  Blood gas, arterial  Tomorrow morning,   R     01/15/19 1244   01/16/19 0500  CBC  Tomorrow morning,   R     01/15/19 1252   01/16/19 7408  Basic metabolic panel  Tomorrow morning,   R     01/15/19 1252   01/16/19 0500  Magnesium  Tomorrow morning,   R     01/15/19 1252   01/16/19 0500  Phosphorus  Tomorrow morning,   R     01/15/19 1252   01/16/19 0500  Blood gas, arterial  Tomorrow morning,   R     01/15/19 1252   01/15/19 1251  Magnesium  Once,   STAT     01/15/19 1252   01/15/19 1251  Phosphorus  Once,   STAT     01/15/19 1252   01/15/19 1251  Blood gas, arterial  Once,   R     01/15/19 1252   01/15/19 1245  Hemoglobin A1c  Once,   STAT    Comments: To assess prior glycemic control    01/15/19 1245   01/15/19 1242  HIV antibody (Routine Testing)  Once,   STAT     01/15/19 1244   01/15/19 1045  Urinalysis, Routine w reflex microscopic  Once,   STAT     01/15/19 1044   01/15/19 1014  Culture, blood (routine x 2)  BLOOD CULTURE X 2,   STAT     01/15/19 1013   01/15/19 1014  Lactic acid, plasma  Now then every 2 hours,   STAT     01/15/19 1013   01/15/19 1014  Triglycerides  (propofol  (DIPRIVAN))  Every 72 hours,   R    Comments: While on propofol (DIPRIVAN)    01/15/19 1014          Vitals/Pain Today's Vitals   01/15/19 1130 01/15/19 1145 01/15/19 1215 01/15/19 1230  BP: (!) 77/63 (!) 86/63 109/70 114/83  Pulse: 70     Resp: 11 16 18 18   Temp:      TempSrc:      SpO2: 100%     Weight:      Height:        Isolation Precautions No active isolations  Medications Medications  propofol (DIPRIVAN) 1000 MG/100ML infusion (10 mcg/kg/min  100 kg Intravenous Rate/Dose Change 01/15/19 1058)  fentaNYL (SUBLIMAZE) injection 50 mcg (has no administration in time range)  fentaNYL (  SUBLIMAZE) injection 50 mcg (has no administration in time range)  fentaNYL (SUBLIMAZE) 100 MCG/2ML injection (has no administration in time range)  propofol (DIPRIVAN) 1000 MG/100ML infusion (has no administration in time range)  fentaNYL (SUBLIMAZE) 100 MCG/2ML injection (has no administration in time range)  levothyroxine (SYNTHROID, LEVOTHROID) injection 50 mcg (has no administration in time range)  heparin injection 5,000 Units (has no administration in time range)  pantoprazole (PROTONIX) injection 40 mg (has no administration in time range)  insulin aspart (novoLOG) injection 0-15 Units (has no administration in time range)  amiodarone (PACERONE) tablet 200 mg (has no administration in time range)  etomidate (AMIDATE) injection (20 mg Intravenous Given 01/15/19 1008)  succinylcholine (ANECTINE) injection (100 mg Intravenous Given 01/15/19 1008)  fentaNYL (SUBLIMAZE) injection (50 mcg Intravenous Given 01/15/19 1027)  fentaNYL (SUBLIMAZE) injection (50 mcg Intravenous Given 01/15/19 1104)    Mobility walks     Focused Assessments Pulmonary Assessment Handoff:  Lung sounds: Bilateral Breath Sounds: Rales O2 Device: NRB        R Recommendations: See Admitting Provider Note  Report given to:   Additional Notes:

## 2019-01-15 NOTE — ED Notes (Signed)
[phone] Veronica Burke, Daughter

## 2019-01-15 NOTE — ED Provider Notes (Addendum)
[facility] EMERGENCY DEPARTMENT Provider Note   CSN: [id number] Arrival date & time: 01/15/19  1001    History   Chief Complaint Chief Complaint  Patient presents with  . Respiratory Distress    HPI Veronica Burke is a 63 y.o. female.     Pt presents to the ED today in severe resp distress.  The pt has a hx of cardiomyopathy and CHF.  Her last Echo showed an EF of 20-25%.  She is on a constant infusion of milrinone.  She is not a candidate for VAD or transplant due to a combination of CKD, obesity, and support system.  She had a mitra-clip placed on 12/06/18   Last ECHO (7/2):  Transthoracic Echocardiogram Report    Name: Veronica Burke  Study Date: 01/04/2019  Height: 61 in  MRN: [id number]  Weight: 187 lb  DOB: 1955-12-24  Gender: Female  BSA: 1.8 m2  Age: 62 yrsEthnicity: Black  BP: 153/79 mmHg  Reason For Study: Mitral valve insufficiency, unspecified etiology  HR: 75    Ordering Physician: [id number] [facility], DAVID  Performed By: Geoffery Lyons  Fellow: Blenda Bridegroom  Referring Physician: ZHAO, DAVID [place]  Image Quality: Technically adequate.  -  SUMMARY  The left ventricle is moderately dilated.  There is normal left ventricular wall thickness.    Left ventricular systolic function is severely reduced.  LV ejection fraction = 20-25%.  There is severe global hypokinesis of the left ventricle.  The right ventricle is mildly dilated.  The right ventricular systolic function is normal.  Device lead in the right ventricle  The left atrium is moderately dilated.  Mild ASD by color flow doppler. There is L-R interatrial shunt.  There is a single MitraClip.  There is moderate posteriorly directed mitral regurgitation.  The mean gradient across the mitral valve is 4.1 mmHg.  HR 63 bpm  There is mild aortic regurgitation.  There is moderate tricuspid regurgitation.  Moderate pulmonary  hypertension.  Estimated right ventricular systolic pressure is 51 mmHg.  Dilated IVC consistent with elevated RA pressure.  There is no pericardial effusion.  As compared to prior study, the MR has worsened, there is evidence of   increased R sided pressures and an ASD with L-R shunting is more evident.  -  FINDINGS:    LEFT VENTRICLE  The left ventricle is moderately dilated. There is normal left ventricular   wall thickness. Left ventricular systolic function is severely reduced. LV   ejection fraction = 20-25%. Left ventricular filling pattern is prolonged   relaxation. There is severe global hypokinesis of the left ventricle.   Abnormal (paradoxical) septal motion consistent with LBBB.  -    RIGHT VENTRICLE  The right ventricle is mildly dilated. Device lead in the right ventricle.   The right ventricular systolic function is normal.    LEFT ATRIUM  The left atrium is moderately dilated.    RIGHT ATRIUM    Right atrial size is normal. Mild ASD by color flow doppler. There is L-R   interatrial shunt.  -  AORTIC VALVE  The aortic valve is trileaflet. There is aortic valve sclerosis. The aortic   valve opens well. There is no aortic stenosis. There is mild aortic   regurgitation.  -  MITRAL VALVE  There is moderate mitral regurgitation. The regurgitant jet is   posteriorly-directed. The mean gradient across the mitral valve is 4.1 mmHg.   HR 63  bpm. There is a single MitraClip of the A3-P3 leaflets.  -  TRICUSPID VALVE  The tricuspid valve is normal in structure and function. There is moderate   tricuspid regurgitation. Estimated right ventricular systolic pressure is 51   mmHg. Moderate pulmonary hypertension.  -  PULMONIC VALVE  Structurally normal pulmonic valve. Mild pulmonic valvular regurgitation.  -  ARTERIES  The aortic sinus is normal size.  -  VENOUS  Pulmonary venous flow pattern not well visualized.  The inferior vena cava is   mildly dilated with a decrease in inspiratory collapse. Dilated IVC   consistent with elevated RA pressure.  -  EFFUSION  There is no pericardial effusion.  -  -  MMode/2D Measurements & Calculations  IVSd: 0.91 cm  LVIDd: 5.7 cm  LVPWd: 1.00 cm  LVIDs: 5.3 cm  LA diam: 3.8 cm  EDV(MOD-sp4): 148.7 ml  ESV(MOD-sp4): 112.0 ml  EDV(MOD-sp2): 137.9 ml  ESV(MOD-sp2): 104.2 ml  Ao sinus diam: 2.6 cm  asc Aorta Diam: 3.4 cm  LVOT diam: 2.0 cm  SV(MOD-sp4): 36.7 ml  SI(MOD-sp4): 20.0 ml/m2  IVC 1: 2.2 cm  LA area A2: 20.8 cm2  LA area A4: 29.0 cm2  LA vol: 76.9 ml  LA vol index: 41.9 ml/m2  RA area A4: 17.2 cm2  Doppler Measurements & Calculations  MV E max vel: 166.1 cm/sec  MV A max vel: 109.1 cm/sec  MV E/A: 1.5  Med Peak E' Vel: 3.5 cm/sec  Lat Peak E' Vel: 6.7 cm/sec  E/Lat E`: 24.8  E/Med E`: 46.8  MV max PG: 11.8 mmHg  MV mean PG: 4.1 mmHg  MVA(P1/2t): 3.2 cm2  MVA(VTI): 2.0 cm2  MV P1/2t max vel: 172.6 cm/sec  MV P1/2t: 68.4 msec  MV dec time: 0.17 sec  SV(LVOT): 85.2 ml  Ao V2 max: 139.7 cm/sec  Ao max PG: 7.8 mmHg  Ao V2 mean: 91.9 cm/sec  Ao mean PG: 3.8 mmHg  Ao V2 VTI: 28.1 cm  AVA (VTI): 3.0 cm2  AI max vel: 407.2 cm/sec  AI dec slope: 234.3 cm/sec2  AI P1/2t: 509.0 msec  LV V1 VTI: 27.3 cm  MR ERO: 0.17 cm2  MR PISA radius: 0.70 cm  MR alias vel: 26.0 cm/sec  TR max vel: 302.3 cm/sec  TR max PG: 36.6 mmHg  RVSP(TR): 51.6 mmHg  RAP systole: 15.0 mmHg  AS Dimensionless Index (VTI): 0.97  AVAi(VTI) cm^2/m^2: 1.7 cm2  SV index(LVOT): 46.4 ml/m2      Reading Physician:  MD Dorothe Pea, [phone] 01/04/2019 11:04 AM  [facility]   CUS TTE SURFACE COMPLETE ECHO ADULT (01/04/2019 8:46 AM EDT) Procedure Note Interface, External Ris In - 01/04/2019 11:09 AM EDT  [place], [place]. [address]  Transthoracic Echocardiogram Report  Name: Veronica Burke Study Date: 01/04/2019 Height: 61 in MRN: [id number] Weight: 187 lb DOB: [birth year] Gender: Female BSA: 1.8 m2 Age: 42 yrsEthnicity: Black BP: 153/79 mmHg Reason For Study: Mitral valve insufficiency, unspecified etiology HR: 75  Ordering Physician: [id number] [facility], DAVID Performed By: Geoffery Lyons Fellow: Blenda Bridegroom Referring Physician: Maylon Peppers, DAVID XIAO-MING  Pt is unable to give any hx.  Per EMS, she was driving back from [place] and felt progressively sob.  When EMS arrived, she had bilateral rales.  She became unresponsive, and hr went down to the 40s, and she became apneic.  EMS did bag her for about 5-6 minutes.  They put her on cpap for transport.  RA O2 sats  in the upper 60s.  Pt is minimally responsive upon arrival.     No past medical history on file.  There are no active problems to display for this patient.    PAST MEDICAL, SOCIAL AND FAMILY HISTORY:  Past Medical History:  Past Medical History:  Diagnosis Date  . Arthritis  . Cardiomyopathy ([place])  . CHF (congestive heart failure) ([place])  . Chronic kidney disease  . Diabetes mellitus ([place])  . Hyperlipidemia  . Hypertension  . OSA on CPAP  . Sleep apnea with use of continuous positive airway pressure (CPAP)  . Thyroid disease   Past Surgical History:  Past Surgical History:  Procedure Laterality Date  . CARDIAC DEFIBRILLATOR PLACEMENT  . CARDIAC DEFIBRILLATOR PLACEMENT  . CESAREAN SECTION  . HYSTERECTOMY  . HYSTERECTOMY  . MOUTH SURGERY      S/P mitral valve clip implantation     OB History   No obstetric history on file.    Family History  Problem Relation Age of Onset  . Heart disease Mother  . Hypertension Mother  . Diabetes Mother  . Kidney disease Mother  . Cancer Father  . Hypertension Sister  . Heart disease Sister   Social History   Tobacco Use  . Smoking status: Never Smoker  . Smokeless tobacco: Never Used   Substance Use Topics  . Alcohol use: No  . Drug use: No    Home Medications    Prior to Admission medications   Medication Sig Start Date End Date Taking? Authorizing Provider  amiodarone (PACERONE) 200 MG tablet Take 200 mg by mouth daily. 12/18/18   [provider]  atorvastatin (LIPITOR) 40 MG tablet Take 40 mg by mouth daily. 12/13/18   [provider]  carvedilol (COREG) 6.25 MG tablet Take 6.25 mg by mouth 2 (two) times a day. 11/20/18   [provider]  clopidogrel (PLAVIX) 75 MG tablet Take 75 mg by mouth daily. 12/07/18   [provider]  FERREX 150 150 MG capsule Take 150 mg by mouth every Monday, Wednesday, and Friday. 01/04/19   [provider]  hydrALAZINE (APRESOLINE) 25 MG tablet Take 50 mg by mouth 3 (three) times daily. 01/04/19   [provider]  isosorbide dinitrate (ISORDIL) 20 MG tablet Take 20 mg by mouth 3 (three) times daily. 12/22/18   [provider]  JANUVIA 50 MG tablet Take 50 mg by mouth daily. 12/21/18   [provider]  LEVEMIR FLEXTOUCH 100 UNIT/ML Pen Inject 15 Units into the skin at bedtime. 12/13/18   [provider]  levothyroxine (SYNTHROID) 100 MCG tablet Take 100 mcg by mouth daily before breakfast. 12/22/18   [provider]  magnesium oxide (MAG-OX) 400 MG tablet Take 1 tablet by mouth 2 (two) times daily. 12/08/18   [provider]  torsemide (DEMADEX) 20 MG tablet Take 20 mg by mouth daily. 12/18/18   [provider]  VICTOZA 18 MG/3ML SOPN Inject 1.8 mg into the skin daily. 01/04/19   [provider]   Drug(s): Milrinone CIVI 3.71ml/hr 10.63mcg/min   Family History No family history on file.  Social History Social History   Tobacco Use  . Smoking status: Not on file  Substance Use Topics  . Alcohol use: Not on file  . Drug use: Not on file     Allergies   Patient has no known allergies.   Review of Systems Review of Systems   Unable to perform ROS: Severe respiratory distress  Physical Exam Updated Vital Signs BP (!) 86/63   Pulse 70   Temp (!) 96.7 F (35.9 C) (Axillary)   Resp 16   Ht 5\' 5"  (1.651 m)   Wt 100 kg   SpO2 100%   BMI 36.69 kg/m   Physical Exam Vitals signs and nursing note reviewed.  Constitutional:      General: She is in acute distress.     Appearance: She is ill-appearing.  HENT:     Head: Normocephalic and atraumatic.     Right Ear: External ear normal.     Left Ear: External ear normal.     Nose: Nose normal.     Mouth/Throat:     Mouth: Mucous membranes are dry.  Eyes:     Extraocular Movements: Extraocular movements intact.     Conjunctiva/sclera: Conjunctivae normal.     Pupils: Pupils are equal, round, and reactive to light.  Neck:     Musculoskeletal: Normal range of motion.  Cardiovascular:     Rate and Rhythm: Regular rhythm. Tachycardia present.     Pulses: Normal pulses.     Heart sounds: Normal heart sounds.  Pulmonary:     Effort: Respiratory distress present.     Breath sounds: Rales present.  Abdominal:     General: Abdomen is flat. Bowel sounds are normal.     Palpations: Abdomen is soft.  Musculoskeletal: Normal range of motion.  Skin:    General: Skin is warm.     Capillary Refill: Capillary refill takes less than 2 seconds.  Neurological:     Mental Status: She is alert.     Comments: Pt opens eyes when I speak to her.  She is unable to speak due to sob.      ED Treatments / Results  Labs (all labs ordered are listed, but only abnormal results are displayed) Labs Reviewed  BASIC METABOLIC PANEL - Abnormal; Notable for the following components:      Result Value   CO2 18 (*)    Glucose, Bld 279 (*)    BUN 33 (*)    Creatinine, Ser 2.36 (*)    GFR calc non Af Amer 21 (*)    GFR calc Af Amer 24 (*)    Anion gap 19 (*)    All other components within normal limits  CBC WITH DIFFERENTIAL/PLATELET - Abnormal; Notable for the following  components:   RBC 5.22 (*)    HCT 48.0 (*)    MCHC 28.8 (*)    RDW 17.0 (*)    Platelets 136 (*)    All other components within normal limits  BRAIN NATRIURETIC PEPTIDE - Abnormal; Notable for the following components:   B Natriuretic Peptide 595.2 (*)    All other components within normal limits  LACTIC ACID, PLASMA - Abnormal; Notable for the following components:   Lactic Acid, Venous 6.6 (*)    All other components within normal limits  POCT I-STAT 7, (LYTES, BLD GAS, ICA,H+H) - Abnormal; Notable for the following components:   pH, Arterial 7.334 (*)    pCO2 arterial 50.2 (*)    Sodium 147 (*)    All other components within normal limits  TROPONIN I (HIGH SENSITIVITY) - Abnormal; Notable for the following components:   Troponin I (High Sensitivity) 18 (*)    All other components within normal limits  SARS CORONAVIRUS 2 (HOSPITAL ORDER, [facility])  CULTURE, BLOOD (ROUTINE X 2)  CULTURE, BLOOD (ROUTINE X 2)  TRIGLYCERIDES  LACTIC ACID, PLASMA  URINALYSIS, ROUTINE W REFLEX MICROSCOPIC  TROPONIN I (HIGH SENSITIVITY)    EKG EKG Interpretation  Date/Time:  Monday January 15 2019 10:03:43 EDT Ventricular Rate:  94 PR Interval:    QRS Duration: 176 QT Interval:  440 QTC Calculation: 551 R Axis:   -178 Text Interpretation:  VENTRICULAR PACED RHYTHM Confirmed by Isla Pence [phone]) on 01/15/2019 10:47:34 AM   Radiology Dg Chest Port 1 View  Result Date: 01/15/2019 CLINICAL DATA:  Endotracheal and orogastric tube placement. EXAM: PORTABLE CHEST 1 VIEW COMPARISON:  Chest CT June 29, 2018 FINDINGS: Endotracheal tube terminates 4 cm above the carina. Enteric catheter transverses the thorax and descends into the expected location of gastric body, tip collimated off the image. Single lead cardiac pacemaker in place. The cardiac silhouette is enlarged. Mediastinal contours appear intact. Bilateral patchy alveolar and interstitial opacities with central  predominance. No evidence of pneumothorax. Osseous structures are without acute abnormality. Soft tissues are grossly normal. IMPRESSION: 1. Support apparatus as described. 2. Bilateral patchy alveolar and interstitial opacities with central predominance. This may represent mixed pattern pulmonary edema or bilateral airspace consolidation. 3. Enlarged cardiac silhouette. Electronically Signed   By: Fidela Salisbury M.D.   On: 01/15/2019 10:40    Procedures Procedure Name: Intubation Date/Time: 01/15/2019 11:03 AM Performed by: Isla Pence, MD Pre-anesthesia Checklist: Patient identified, Patient being monitored, Emergency Drugs available, Timeout performed and Suction available Oxygen Delivery Method: Non-rebreather mask Preoxygenation: Pre-oxygenation with 100% oxygen Induction Type: Rapid sequence Ventilation: Mask ventilation without difficulty Laryngoscope Size: Glidescope and 4 Grade View: Grade I Tube size: 7.5 mm Number of attempts: 1 Placement Confirmation: ETT inserted through vocal cords under direct vision,  CO2 detector and Breath sounds checked- equal and bilateral Dental Injury: Teeth and Oropharynx as per pre-operative assessment       (including critical care time)  Medications Ordered in ED Medications  propofol (DIPRIVAN) 1000 MG/100ML infusion (10 mcg/kg/min  100 kg Intravenous Rate/Dose Change 01/15/19 1058)  fentaNYL (SUBLIMAZE) injection 50 mcg (has no administration in time range)  fentaNYL (SUBLIMAZE) injection 50 mcg (has no administration in time range)  fentaNYL (SUBLIMAZE) 100 MCG/2ML injection (has no administration in time range)  propofol (DIPRIVAN) 1000 MG/100ML infusion (has no administration in time range)  fentaNYL (SUBLIMAZE) 100 MCG/2ML injection (has no administration in time range)  etomidate (AMIDATE) injection (20 mg Intravenous Given 01/15/19 1008)  succinylcholine (ANECTINE) injection (100 mg Intravenous Given 01/15/19 1008)  fentaNYL  (SUBLIMAZE) injection (50 mcg Intravenous Given 01/15/19 1027)  fentaNYL (SUBLIMAZE) injection (50 mcg Intravenous Given 01/15/19 1104)     Initial Impression / Assessment and Plan / ED Course  I have reviewed the triage vital signs and the nursing notes.  Pertinent labs & imaging results that were available during my care of the patient were reviewed by me and considered in my medical decision making (see chart for details).       Pt has a right arm picc line with milrinone infusing.  I did not stop this infusion.  After intubation, I ordered propofol and prn fentanyl for sedation.   Pt is clinically in a fluid exac.   I don't think she has pna.  covid neg.  BP is low, so I did not order additional lasix.  Pt has a [employer]tronic pacemaker which showed no cardiac events.  She has been retaining fluids.  Pt d/w Cards who will see pt.  Pt d/w CCM who will admit.  I attempted to  call the person who was in the car with her, but she did not answer the phone.  This person is her daughter and called later to get an update.  The pt's nurse filled daughter in on the situation.  Veronica Burke was evaluated in Emergency Department on 01/15/2019 for the symptoms described in the history of present illness. She was evaluated in the context of the global COVID-19 pandemic, which necessitated consideration that the patient might be at risk for infection with the SARS-CoV-2 virus that causes COVID-19. Institutional protocols and algorithms that pertain to the evaluation of patients at risk for COVID-19 are in a state of rapid change based on information released by regulatory bodies including the CDC and federal and state organizations. These policies and algorithms were followed during the patient's care in the ED.  CRITICAL CARE Performed by: Isla Pence   Total critical care time: 60 minutes  Critical care time was exclusive of separately billable procedures and treating other patients.  Critical  care was necessary to treat or prevent imminent or life-threatening deterioration.  Critical care was time spent personally by me on the following activities: development of treatment plan with patient and/or surrogate as well as nursing, discussions with consultants, evaluation of patient's response to treatment, examination of patient, obtaining history from patient or surrogate, ordering and performing treatments and interventions, ordering and review of laboratory studies, ordering and review of radiographic studies, pulse oximetry and re-evaluation of patient's condition.  Final Clinical Impressions(s) / ED Diagnoses   Final diagnoses:  SOB (shortness of breath)  Acute respiratory failure with hypoxia (HCC)  Acute on chronic congestive heart failure, unspecified heart failure type (HCC)  CKD (chronic kidney disease) stage 4, GFR 15-29 ml/min [facility])    ED Discharge Orders    None       Isla Pence, MD 01/15/19 1210    Isla Pence, MD 01/15/19 1222

## 2019-01-15 NOTE — ED Notes (Signed)
Medtronic pacer interrogated

## 2019-01-15 NOTE — Progress Notes (Signed)
Pharmacy Antibiotic Note  Veronica Burke is a 63 y.o. female admitted on 01/15/2019 with shock now intubated for airway protection.  On home milrinone with PICC line in place.  Milrinone > Dobutabmine with hypotension and added NE now at 16,mg/min, coox 60 Tm 102, wbc wnl on admit, PCT13, Cr 2.36, (BL2) .  Pharmacy has been consulted for vancomycin and cefepime dosing.  Plan: Vancomycin 1250mg  IV q24hr Cefepime 2gmQ24h  Height: 5\' 5"  (165.1 cm) Weight: 220 lb 7.4 oz (100 kg) IBW/kg (Calculated) : 57  Temp (24hrs), Avg:101.8 F (38.8 C), Min:96.7 F (35.9 C), Max:103.8 F (39.9 C)  Recent Labs  Lab 01/15/19 1013 01/15/19 1014 01/15/19 1520  WBC 8.1  --   --   CREATININE 2.36*  --   --   LATICACIDVEN  --  6.6* 1.2    Estimated Creatinine Clearance: 28.2 mL/min (A) (by C-G formula based on SCr of 2.36 mg/dL (H)).    No Known Allergies  Antimicrobials this admission:   Dose adjustments this admission:   Microbiology results: 7/13 BCx 7/13 Ucx  7/13 TA    Bonnita Nasuti Pharm.D. CPP, BCPS Clinical Pharmacist [phone] 01/15/2019 10:50 PM

## 2019-01-15 NOTE — Sedation Documentation (Signed)
Pt successfully intubated 

## 2019-01-15 NOTE — H&P (Addendum)
NAME:  Veronica Burke, MRN:  [id number], DOB:  Sep 08, 1955, LOS: 0 ADMISSION DATE:  01/15/2019, CONSULTATION DATE:  7/13 REFERRING MD:  Dr. Gilford Raid EDP, CHIEF COMPLAINT:  CHF   Brief History   63 year old female with advanced CHF on milrinone infusion chronically presented with hypoxia and respiratory difficulty requiring intubation.   History of present illness   63 year old female with past medical history as below, which is significant for systolic congestive heart failure with EF 20 to 25% on home milrinone infusion and chronic kidney disease.  This is managed at [facility]. She declined bed/heart transplant based on multiple factors and was referred to [facility] but declined due to insurance reasons.  She had 2 recent admissions for episodes of flash pulmonary edema and is now status post MitraClip in June of this year.  After discharge she has been doing well and had increase her exercise tolerance significantly.  She was most recently seen by her cardiologist on 01/04/2019 were overall encouraged with her recent performance and increase hydralazine.   7/13 she was driving in the car through [place] when she developed respiratory distress and presented to [facility] ED. She was profoundly hypoxic on initial exam and was intubated for respiratory insufficiency. Imaging consistent with pulmonary edema. Laboratory evaluation significant for.   Past Medical History   has a past medical history of CHF (congestive heart failure) ([place]), CKD (chronic kidney disease), DM (diabetes mellitus) ([place]), Dyslipidemia, Hypothyroid, Mitral regurgitation, and OSA (obstructive sleep apnea).  Significant Hospital Events   7/13 admit  Consults:  Cardiology  Procedures:    Significant Diagnostic Tests:   Echo 7/2: Left ventricular systolic function is severely reduced.LV ejection fraction = 20-25%.There is severe global hypokinesis of the left ventricle.The right ventricle is mildly  dilated.The right ventricular systolic function is normal. There is moderate posteriorly directed mitral regurgitation. Moderate pulmonary hypertension.  RHC 11/2018 Right IJ venous access Right radial arterial access PASP - 70mmHg PAWP - 74mmHg Widely patent coronaries EBL<13ml  Pressures   RA 9 mmHg/9 mmHg/8 mmHg  RV 34 mmHg/-1 mmHg/4 mmHg  PA 35 mmHg/21 mmHg/17 mmHg  PCW 18 mmHg/20 mmHg/18 mmHg  LA / /  LV 115 mmHg/6 mmHg/19 mmHg  AO 119 mmHg/66 mmHg/86 mmHg  BP 108/68   Derived Parameters   PVR  PVR Index  SVR 2000  SVR Index 3637.9   Cardiac Output   Fick A-V O2 Diff 52.4 mL/L  Fick O2 Consumption  Fick Predicted O2 Consumption 163.5 mL/min  Fick CO 3.12 bpm  Fick CI 1.72  Thermo CO  Thermo CI   Micro Data:  7/13 blood >  Antimicrobials:    Interim history/subjective:    Objective   Blood pressure (!) 86/63, pulse 70, temperature (!) 96.7 F (35.9 C), temperature source Axillary, resp. rate 16, height 5\' 5"  (1.651 m), weight 100 kg, SpO2 100 %.    Vent Mode: PRVC FiO2 (%):  [100 %] 100 % Set Rate:  [18 bmp] 18 bmp Vt Set:  [490 mL] 490 mL PEEP:  [5 cmH20] 5 cmH20 Plateau Pressure:  [30 cmH20] 30 cmH20  No intake or output data in the 24 hours ending 01/15/19 1207 Filed Weights   01/15/19 1015  Weight: 100 kg    Examination: General: elderly appearing female on vent HENT: New /AT, PERRL, mild JVD Lungs: Clear bilateral breath sounds Cardiovascular: RRR, no MRG Abdomen: Soft, non-tender, non-distended Extremities: RUE PICC, +1 LE edema.  Neuro: Sedated. Eyes open to voice. Nods  in understanding.   Resolved Hospital Problem list     Assessment & Plan:   Acute on chronic HFrEF: LVEF 20-25% recently. On home milrinone infusion 0.125. She was under consideration for LVAD/transplant. Daughter tells me that she  and was taken out of consideration.  - Continue milrinone home infusion for now.  - Cardiology/advanced heart failure consulted.    - Holding diuresis for now with borderline BP  - Holding home carvedilol, hydralazine, isosorbide, torsemide with borderline BP.  - Continue amiodarone.   Acute hypoxemic respiratory failure: pulmonary edema - Vent - Daily SBT - Propofol infusion, PRN fentanyl for RASS -1 to -2.   Acute on CKD: creatinine 2.36, recent baseline 1.76 - KVO - Follow BMP - Has apparently declined renal transplant in the past as well  DM - CBG monitoring and SSI - Holding victoza, januvia  Metabolic acidosis: elevated anion gap. Lactic 6.6 on admit.  - Trend lactic with WOB improved and BP control.   Hypothyroid - Check TSH - IV synthroid  Best practice:  Diet:  NPO Pain/Anxiety/Delirium protocol (if indicated): Propofol VAP protocol (if indicated): Yes DVT prophylaxis: SQH GI prophylaxis: PPI Glucose control: SSI Mobility: BR Code Status: FULL Family Communication: Daughter and Husband updated. Full Code Disposition: ICU  Labs   CBC: Recent Labs  Lab 01/15/19 1013 01/15/19 1134  WBC 8.1  --   NEUTROABS 4.5  --   HGB 13.8 14.3  HCT 48.0* 42.0  MCV 92.0  --   PLT 136*  --     Basic Metabolic Panel: Recent Labs  Lab 01/15/19 1013 01/15/19 1134  NA 145 147*  K 4.6 3.7  CL 108  --   CO2 18*  --   GLUCOSE 279*  --   BUN 33*  --   CREATININE 2.36*  --   CALCIUM 9.0  --    GFR: Estimated Creatinine Clearance: 28.2 mL/min (A) (by C-G formula based on SCr of 2.36 mg/dL (H)). Recent Labs  Lab 01/15/19 1013 01/15/19 1014  WBC 8.1  --   LATICACIDVEN  --  6.6*    Liver Function Tests: No results for input(s): AST, ALT, ALKPHOS, BILITOT, PROT, ALBUMIN in the last 168 hours. No results for input(s): LIPASE, AMYLASE in the last 168 hours. No results for input(s): AMMONIA in the last 168 hours.  ABG    Component Value Date/Time   PHART 7.334 (L) 01/15/2019 1134   PCO2ART 50.2 (H) 01/15/2019 1134   PO2ART 87.0 01/15/2019 1134   HCO3 26.7 01/15/2019 1134   TCO2 28  01/15/2019 1134   O2SAT 96.0 01/15/2019 1134     Coagulation Profile: No results for input(s): INR, PROTIME in the last 168 hours.  Cardiac Enzymes: No results for input(s): CKTOTAL, CKMB, CKMBINDEX, TROPONINI in the last 168 hours.  HbA1C: No results found for: HGBA1C  CBG: No results for input(s): GLUCAP in the last 168 hours.  Review of Systems:   Unable as patient is encephalopathic and intubated.   Past Medical History  She,  has no past medical history on file.   Surgical History     Social History      Family History   Her family history is not on file.   Allergies No Known Allergies   Home Medications  Prior to Admission medications   Medication Sig Start Date End Date Taking? Authorizing Provider  amiodarone (PACERONE) 200 MG tablet Take 200 mg by mouth daily. 12/18/18   [provider]  atorvastatin (  LIPITOR) 40 MG tablet Take 40 mg by mouth daily. 12/13/18   [provider]  carvedilol (COREG) 6.25 MG tablet Take 6.25 mg by mouth 2 (two) times a day. 11/20/18   [provider]  clopidogrel (PLAVIX) 75 MG tablet Take 75 mg by mouth daily. 12/07/18   [provider]  FERREX 150 150 MG capsule Take 150 mg by mouth every Monday, Wednesday, and Friday. 01/04/19   [provider]  hydrALAZINE (APRESOLINE) 25 MG tablet Take 50 mg by mouth 3 (three) times daily. 01/04/19   [provider]  isosorbide dinitrate (ISORDIL) 20 MG tablet Take 20 mg by mouth 3 (three) times daily. 12/22/18   [provider]  JANUVIA 50 MG tablet Take 50 mg by mouth daily. 12/21/18   [provider]  LEVEMIR FLEXTOUCH 100 UNIT/ML Pen Inject 15 Units into the skin at bedtime. 12/13/18   [provider]  levothyroxine (SYNTHROID) 100 MCG tablet Take 100 mcg by mouth daily before breakfast. 12/22/18   [provider]  magnesium oxide (MAG-OX) 400 MG tablet Take 1 tablet by mouth 2 (two) times daily. 12/08/18   [provider]  torsemide (DEMADEX) 20 MG tablet Take 20 mg by mouth daily. 12/18/18   [provider]  VICTOZA 18 MG/3ML SOPN Inject 1.8 mg into the skin daily. 01/04/19   [provider]     Critical care time: 40 mins     Georgann Housekeeper, AGACNP-BC [facility] Pager [phone] or [phone]  01/15/2019 12:39 PM  Attending Note:  63 year old female with extensive cardiac history who skipped her medications this AM and while driving became very short of breath and lost consciousness.  EMS was called, they placed her on CPAP and was transferred to the ER where she was intubated.  No events since.  On exam, she is unresponsive, intubated with coarse BS diffusely.  Milrinone pump bedside and not infusing.  I reviewed CXR myself, ETT is in a good position with pulmonary edema noted.  Discussed with PCCM-NP.  Will admit to the cardiac ICU.  Consult advanced heart failure team.  Hold of lasix given marginal BP.  Replace electrolytes.  BMET in AM.  KVO IVF.  PCCM will admit.  The patient is critically ill with multiple organ systems failure and requires high complexity decision making for assessment and support, frequent evaluation and titration of therapies, application of advanced monitoring technologies and extensive interpretation of multiple databases.   Critical Care Time devoted to patient care services described in this note is  40  Minutes. This time reflects time of care of this signee Dr Jennet Maduro. This critical care time does not reflect procedure time, or teaching time or supervisory time of PA/NP/Med student/Med Resident etc but could involve care discussion time.  Rush Farmer, M.D. [facility] Pulmonary/Critical Care Medicine. Pager: [phone]. After hours pager: [phone]

## 2019-01-15 NOTE — Consult Note (Addendum)
Cardiology Consultation:   Patient ID: Veronica Burke MRN: [id number]; DOB: 1955-11-01  Admit date: 01/15/2019 Date of Consult: 01/15/2019  Primary Care Provider: Patient, No Pcp Per Primary Cardiologist: [facility] - Dr. Maylon Peppers, Dr. Farris Has most recently Primary Electrophysiologist:   ICD - Minna Merritts?   Patient Profile:   [name] is a 63 y.o. female with a hx of NICM/HFrEF on home milronine s/p single chamber Medtronic ICD implant, moderate to severe mitral regurgitation s/p MitraClip 12/06/18, CKD stage III, DM, hypothyroidism, HTN, HLD, OSA, hypothyroidism, arthritis, prior NSVT on device interrogation who is being seen today for the evaluation of respiratory distress at the request of Dr. Nelda Marseille  History of Present Illness:   The patient is intubated and therefore history primarily obtained via [place]. She has seen several providers over her heart failure journey. The majority of the notes indicate she is reported to have been diagnosed with NICM around 2014 and has not had significant improvement in LV function with medical therapy so underwent ICD implantation. This may have originally been in [place]. Do not see any reference to prior LHC or cMRI. Note from 2017 indicates she had a negative cardiolite and low EF 20-25% back to 2014. She established with [facility] a few years back. Per notes from 01/2019, "Briefly, the patient has longstanding systolic heart failure (approximately 20 years) but did well until around 2019, as well as diabetes and was admitted to [facility] in 06/2018 after a RHC showed low cardiac output evaluated for home milrinone (felt no better with this medication so it was stopped) and then readmitted in 09/2018 directed from the cath lab and was diuresed and eventually discharged on 0.27mcg/kg/min. her renal function has continued to fluctuate during this time. She declined for VAD/heart transplant and heart kidney transplant due to a combination of CKD, obesity, support system. She  was also referred to [facility] but declined due to her insurance (out of network). Given limited options and what appared to be 2 episodes of flash pulmonary edema during her 2 admissions she was referred for mitra-clip, which took place 12/06/2018." At her first post-clip visit, her milrinone was decreased to 0.125. She was last seen by Heart Failure team Dr. Davina Poke 01/04/19. Her BP was running higher so her hydralazine was increased to 50mg  TID. F/u echo showed EF 20-25%, severe global HK, mildly dilated RV, mild ASD with L-R intratrial shunt, Mitra Clip present with moderate posteriorly directed MR and moderate pulmonary HTN and moderate TR with mild AI. There was plan to continue milrinone with eventual hope to wean off completely and eventually f/u general cardiology in [facility] she [facility]. Her last device check in 10/2018 is outlined below with no episodes, <0.1% RV pacing. [place] note from 11/2017 states, "She has a wide-complex and may benefit from consideration of biventricular pacing. She says that has never been discussed." Per notes, she did not tolerate ACE, ARB or spironolactone due to CKD and has prior h/o flash pulmonary edema if her BP gets too high.  Today she was driving through [place] and developed respiratory distress. EMS arrived and she had bilateral rales. She had skipped her medicines this morning. When they had her stand she became unresponsive and bradycardic in the 40's. EMS bagged the patient for 5-6 minutes. Her HR improved, she was placed on CPAP, and transported to the ER where she was profoundly hypoxic and was intubated for respiratory insufficiency. In speaking with ER they received report from Medtronic rep where device was interrogated and  did not show any cardiac events, but did note progressive fluid overload. Labs show K 4.6, Cr 2.36 (Last OP Cr 1.76 on 7/2), BNP 595, hsTroponin 18, lactic acidosis of 6.6, normal Hgb, Covid negative, UA with >300 protein, pH 7.3334, PCO2  50.2, PO2 87. CXR with bilateral patchy alveolar and interstitial opacities with central predominance, may represent mixed pattern pulmonary edema or bilateral airspace consolidation, may represent enlarged cardiac silhouette.    Past Medical History:  Diagnosis Date   ASD (atrial septal defect)    a. small ASD by echo 01/2019.   Chronic systolic CHF (congestive heart failure) (HCC)    a. on home milrinone.   CKD (chronic kidney disease), stage III (HCC)    DM (diabetes mellitus) ([place])    Dyslipidemia    Essential hypertension    Hypothyroid    ICD (implantable cardioverter-defibrillator) in place    Mitral regurgitation    a. s/p Mitraclip 12/2018.   NICM (nonischemic cardiomyopathy) (HCC)    NSVT (nonsustained ventricular tachycardia) (HCC)    Obesity    OSA (obstructive sleep apnea)    on CPAP   S/P mitral valve clip implantation     Past Surgical History:  Procedure Laterality Date   ICD IMPLANT     MitraClip       Home Medications:  Prior to Admission medications   Medication Sig Start Date End Date Taking? Authorizing Provider  amiodarone (PACERONE) 200 MG tablet Take 200 mg by mouth daily. 12/18/18   [provider]  atorvastatin (LIPITOR) 40 MG tablet Take 40 mg by mouth daily. 12/13/18   [provider]  carvedilol (COREG) 6.25 MG tablet Take 6.25 mg by mouth 2 (two) times a day. 11/20/18   [provider]  clopidogrel (PLAVIX) 75 MG tablet Take 75 mg by mouth daily. 12/07/18   [provider]  FERREX 150 150 MG capsule Take 150 mg by mouth every Monday, Wednesday, and Friday. 01/04/19   [provider]  hydrALAZINE (APRESOLINE) 25 MG tablet Take 50 mg by mouth 3 (three) times daily. 01/04/19   [provider]  isosorbide dinitrate (ISORDIL) 20 MG tablet Take 20 mg by mouth 3 (three) times daily. 12/22/18   [provider]  JANUVIA 50 MG tablet Take 50 mg by mouth daily. 12/21/18   [provider]  LEVEMIR FLEXTOUCH 100 UNIT/ML Pen Inject 15 Units into the skin at bedtime. 12/13/18   [provider]  levothyroxine (SYNTHROID) 100 MCG tablet Take 100 mcg by mouth daily before breakfast. 12/22/18   [provider]  magnesium oxide (MAG-OX) 400 MG tablet Take 1 tablet by mouth 2 (two) times daily. 12/08/18   [provider]  torsemide (DEMADEX) 20 MG tablet Take 20 mg by mouth daily. 12/18/18   [provider]  VICTOZA 18 MG/3ML SOPN Inject 1.8 mg into the skin daily. 01/04/19   [provider]    Inpatient Medications: Scheduled Meds:  amiodarone  200 mg Per Tube Daily   fentaNYL       fentaNYL       heparin  5,000 Units Subcutaneous Q8H   insulin aspart  0-15 Units Subcutaneous Q4H   levothyroxine  50 mcg Intravenous Daily   pantoprazole (PROTONIX) IV  40 mg Intravenous QHS   Continuous Infusions:  propofol (DIPRIVAN) infusion 10 mcg/kg/min (01/15/19 1058)   propofol     PRN Meds: fentaNYL (SUBLIMAZE) injection, fentaNYL (SUBLIMAZE) injection  Allergies:   No Known Allergies  Social  History:   Social History   Socioeconomic History   Marital status: Unknown    Spouse name: Not on file   Number of children: Not on file   Years of education: Not on file   Highest education level: Not on file  Occupational History   Not on file  Social Needs   Financial resource strain: Not on file   Food insecurity    Worry: Not on file    Inability: Not on file   Transportation needs    Medical: Not on file    Non-medical: Not on file  Tobacco Use   Smoking status: Not on file  Substance and Sexual Activity   Alcohol use: Not on file   Drug use: Not on file   Sexual activity: Not on file  Lifestyle   Physical activity    Days per week: Not on file    Minutes per session: Not on file   Stress: Not on file  Relationships   Social connections    Talks on phone: Not on file    Gets together: Not  on file    Attends religious service: Not on file    Active member of club or organization: Not on file    Attends meetings of clubs or organizations: Not on file    Relationship status: Not on file   Intimate partner violence    Fear of current or ex partner: Not on file    Emotionally abused: Not on file    Physically abused: Not on file    Forced sexual activity: Not on file  Other Topics Concern   Not on file  Social History Narrative   Not on file    Family History:   Family History  Family history unknown: Yes   Unable to obtain as pt is intubated/sedated.  ROS:  Please see the history of present illness. Unable to obtain as pt is intubated/sedated.  Physical Exam/Data:   Vitals:   01/15/19 1230 01/15/19 1300 01/15/19 1315 01/15/19 1327  BP: 114/83 130/82 135/79   Pulse:  64 63   Resp: 18 18 18    Temp:      TempSrc:      SpO2:  100% 100% 100%  Weight:      Height:       No intake or output data in the 24 hours ending 01/15/19 1410 Last 3 Weights 01/15/2019  Weight (lbs) 220 lb 7.4 oz  Weight (kg) 100 kg     Body mass index is 36.69 kg/m.  General: Obese AAF in no acute distress. Mildly agitated on vent Head: Normocephalic, atraumatic, sclera non-icteric, no xanthomas, nares are without discharge.  Neck: Negative for carotid bruits. JVD appears mildly elevated but thick neck habitus noted Lungs: Coarse mechanical BS by auscultation bilaterally without wheezes. Patient is supine Breathing is unlabored on vent. Heart: RRR with S1 S2. No murmurs, rubs, or gallops appreciated. Abdomen: Soft, obese, non-tender, non-distended with normoactive bowel sounds. No hepatomegaly. No rebound/guarding. No obvious abdominal masses. Msk:  Tone appears normal for age. Unable to assess strength as pt is intubated. Extremities: No clubbing or cyanosis. No LE edema.  Distal pedal pulses are 2+ and equal bilaterally. Neuro: Moving feet spontaneously, becoming somewhat agitated -  on propofol Psych:  Unable to assess as pt is intubated, not totally responsive to questions  EKG:  The EKG was personally reviewed and demonstrates:  NSR with IVCD 94bpm - listed as V paced rhythm but personally do  not see overt pacer spikes.  Telemetry:  Telemetry was personally reviewed and demonstrates: NSR  Relevant CV Studies:  Transthoracic Echocardiogram Report 01/04/2019  SUMMARY The left ventricle is moderately dilated. There is normal left ventricular wall thickness.  Left ventricular systolic function is severely reduced. LV ejection fraction = 20-25%. There is severe global hypokinesis of the left ventricle. The right ventricle is mildly dilated. The right ventricular systolic function is normal. Device lead in the right ventricle The left atrium is moderately dilated. Mild ASD by color flow doppler. There is L-R interatrial shunt. There is a single MitraClip. There is moderate posteriorly directed mitral regurgitation. The mean gradient across the mitral valve is 4.1 mmHg. HR 63 bpm There is mild aortic regurgitation. There is moderate tricuspid regurgitation. Moderate pulmonary hypertension. Estimated right ventricular systolic pressure is 51 mmHg. Dilated IVC consistent with elevated RA pressure. There is no pericardial effusion. As compared to prior study, the MR has worsened, there is evidence of  increased R sided pressures and an ASD with L-R shunting is more evident. - FINDINGS:  LEFT VENTRICLE The left ventricle is moderately dilated. There is normal left ventricular  wall thickness. Left ventricular systolic function is severely reduced. LV  ejection fraction = 20-25%. Left ventricular filling pattern is prolonged  relaxation. There is severe global hypokinesis of the left ventricle.  Abnormal (paradoxical) septal motion consistent with LBBB. -  RIGHT VENTRICLE The right ventricle is mildly dilated. Device lead in the right ventricle.  The  right ventricular systolic function is normal.  LEFT ATRIUM The left atrium is moderately dilated.  RIGHT ATRIUM  Right atrial size is normal. Mild ASD by color flow doppler. There is L-R  interatrial shunt. - AORTIC VALVE The aortic valve is trileaflet. There is aortic valve sclerosis. The aortic  valve opens well. There is no aortic stenosis. There is mild aortic  regurgitation. - MITRAL VALVE There is moderate mitral regurgitation. The regurgitant jet is  posteriorly-directed. The mean gradient across the mitral valve is 4.1 mmHg.  HR 63 bpm. There is a single MitraClip of the A3-P3 leaflets. - TRICUSPID VALVE The tricuspid valve is normal in structure and function. There is moderate  tricuspid regurgitation. Estimated right ventricular systolic pressure is 51  mmHg. Moderate pulmonary hypertension. - PULMONIC VALVE Structurally normal pulmonic valve. Mild pulmonic valvular regurgitation. - ARTERIES The aortic sinus is normal size. - VENOUS Pulmonary venous flow pattern not well visualized. The inferior vena cava is  mildly dilated with a decrease in inspiratory collapse. Dilated IVC  consistent with elevated RA pressure. - EFFUSION There is no pericardial effusion.  Last device monitor check 10/2018 REMOTE MONITORING ASSESSMENT  Date of Transmission: April 17,2020  Patient MRN: [id number]  Patient Name: Megahn Killings, [birth year], 63 y.o.  Following Provider: Mahala Menghini, MD  Manufacturer of Device: Medtronic  Type of Device: Single Chamber Defibrillator  See scanned/downloaded PDF report for the model numbers, serial numbers, and date of implant.  Presenting Rhythm: VS ~87  Percentage RV / BiV Pacing: <0.1%RV Pacing  Device Findings: Lead and Battery OK; No episodes  Please see downloaded PDF file of transmission under Media Tab for full details of device interrogation to include, when applicable, battery status/charge time, lead trend data, and  programmed parameters.  Congestive Heart Failure Surveillance: No indication of current fluid issue per Optivol  Plan: Will continue remote monitoring.   [employer] 10/27/2018      Laboratory Data:  High Sensitivity Troponin:  Recent Labs  Lab 01/15/19 1013  TROPONINIHS 18*     Cardiac EnzymesNo results for input(s): TROPONINI in the last 168 hours. No results for input(s): TROPIPOC in the last 168 hours.  Chemistry Recent Labs  Lab 01/15/19 1013 01/15/19 1134 01/15/19 1321  NA 145 147* 149*  K 4.6 3.7 3.7  CL 108  --   --   CO2 18*  --   --   GLUCOSE 279*  --   --   BUN 33*  --   --   CREATININE 2.36*  --   --   CALCIUM 9.0  --   --   GFRNONAA 21*  --   --   GFRAA 24*  --   --   ANIONGAP 19*  --   --     No results for input(s): PROT, ALBUMIN, AST, ALT, ALKPHOS, BILITOT in the last 168 hours. Hematology Recent Labs  Lab 01/15/19 1013 01/15/19 1134 01/15/19 1321  WBC 8.1  --   --   RBC 5.22*  --   --   HGB 13.8 14.3 14.6  HCT 48.0* 42.0 43.0  MCV 92.0  --   --   MCH 26.4  --   --   MCHC 28.8*  --   --   RDW 17.0*  --   --   PLT 136*  --   --    BNP Recent Labs  Lab 01/15/19 1013  BNP 595.2*    DDimer No results for input(s): DDIMER in the last 168 hours.   Radiology/Studies:  Dg Chest Port 1 View  Result Date: 01/15/2019 CLINICAL DATA:  Endotracheal and orogastric tube placement. EXAM: PORTABLE CHEST 1 VIEW COMPARISON:  Chest CT June 29, 2018 FINDINGS: Endotracheal tube terminates 4 cm above the carina. Enteric catheter transverses the thorax and descends into the expected location of gastric body, tip collimated off the image. Single lead cardiac pacemaker in place. The cardiac silhouette is enlarged. Mediastinal contours appear intact. Bilateral patchy alveolar and interstitial opacities with central predominance. No evidence of pneumothorax. Osseous structures are without acute abnormality. Soft tissues are grossly  normal. IMPRESSION: 1. Support apparatus as described. 2. Bilateral patchy alveolar and interstitial opacities with central predominance. This may represent mixed pattern pulmonary edema or bilateral airspace consolidation. 3. Enlarged cardiac silhouette. Electronically Signed   By: Fidela Salisbury M.D.   On: 01/15/2019 10:40    Assessment and Plan:   1. Acute respiratory failure with pulmonary edema in setting of acute on chronic systolic CHF - very complex patient - per prior notes, she has a narrow window where she will develop pulmonary edema if her blood pressure gets too high. Suspect this may have been the case if she missed her AM medicines. Optivol also reported to show recent gradual fluid retention recently as well. SBP was in the 70s-80s this AM but now uptrending 130's as she becomes agitated. Home amiodarone has been continued, otherwise milrinone, BB, hydralazine, Imdur, torsemide, MagOx, Plavix, statin not yet resumed. Dr. Haroldine Laws discussed case with Dr. Davina Poke. Given bounce back they recommended to increase milrinone back to prior 0.57mcg/kg/min dose. Dr. Haroldine Laws also suggests to rx IV Lasix 120mg  BID, follow CVP/co-ox, and IV hydralazine 10mg  q4hr PRN SBP>140 (given tendency for pulm edema with higher BPs). He also recommended levophed should she develop recurrent hypotension. CHF team will follow.  2. Moderate-severe MR s/p MitraClip 12/06/18 with residual MR - ? Need for repeat echocardiogram.  Last med list indicated plan for  ASA + Plavix, but our med list only includes Plavix. Per guidelines should remain on both x 3 months then drop Plavix. Will give per tube.  3. AKI on CKD stage III - prior OP value 1.7-1.8 recently. Follow renal function closely. Has not been on ACEI/ARB/spironolactone due to this.   5. Mildly elevated hsTroponin, likely demand process, although underlying coronary hx unclear.  6. Essential HTN - with hypotension at times. Monitor closely.  For  questions or updates, please contact [place] Please consult [email or web address] for contact info under   Signed, Charlie Pitter, PA-C  01/15/2019 2:10 PM   Agree with above  40 y/o woman as above with severe systolic HF (presumed due to NICM) with EF 38-10% complicated by severe MR, CKD 3-4, HTN and recurrent episodes of flash pulmonary edema. We are sked to see by Dr. Nelda Marseille for further management of acute respiratory failure in setting of recurrent pulmonary edema.   She has been followed closely at [facility] by Dr. Farris Has and I spoke with him personally today. As above she has had multiple episodes of flash pulmonary edema. Has been maintained on home milrinone at 0.25. Evaluated for transplant and VAD and not thought to be candidate due to CKD 3 with baseline creatinine 1.7-1.8. Underwent MitraClip about a month ago. About 2 weeks ago milrinone dropped to 0.125.   Apparently was doing fairly well but missed her medicines this morning and developed recurrent flash pulmonary edema with respiratory arrest and severe lactic acidosis. Now waking up on vent and can follow commands. Creatinine up to 2.4. CXR with diffuse edema.   On exam Intubated waking up. Following some commands JVP to jaw Cor RRR Unable to hear HS well over lung crackles Lungs on vent diffuse crackles Ab soft obese NT Ext warm trace edema. RUE PICC  ICD interrogated personally. No acute events. Optivol reflects gradual worsening volume overload. No VT. Personally reviewed  She has recurrent pulmonary edema in setting of medication noncompliance and known EF 20-25% s/p recent MV clip. Course c/b AKI and severe lactic acidosis. I have talked to Dr. Farris Has. Will resume milrinone at 0.25. Diurese aggressively. Watch BP closely as she can get very hypertensive. Check co-ox.   Hopefully will improve quickly with diuresis. We will follow.   CRITICAL CARE Performed by: Glori Bickers  Total critical care time: 35  minutes  Critical care time was exclusive of separately billable procedures and treating other patients.  Critical care was necessary to treat or prevent imminent or life-threatening deterioration.  Critical care was time spent personally by me (independent of midlevel providers or residents) on the following activities: development of treatment plan with patient and/or surrogate as well as nursing, discussions with consultants, evaluation of patient's response to treatment, examination of patient, obtaining history from patient or surrogate, ordering and performing treatments and interventions, ordering and review of laboratory studies, ordering and review of radiographic studies, pulse oximetry and re-evaluation of patient's condition.  Glori Bickers, MD  5:44 PM

## 2019-01-15 NOTE — Progress Notes (Signed)
Dr Haroldine Laws stopped by to see patient, informed of pt having high temps up to 39.9 C since arrival to [facility] and of levophed being started to support pt's blood pressures. Was instructed to stop milrinone gtt at this point, keep dobutamine and wean levophed as pt's blood pressures will tolerate.  Nelva Bush RN

## 2019-01-15 NOTE — ED Triage Notes (Signed)
Pt arrives via EMS for resp distress. Pt riding in car back from [place], feeling progressively SOB. EMS arrived and pt had bilateral rales. When they had pt stand, pt became unresponsive, brady in the 40's. EMS started bagging pt for 5-6 min. HR improved, pt placed on CPAP and remained until arrival at ED. Pt had PICC line with CHF medication in right arm. BP 132/110, HR 100, spo2 85% per EMS

## 2019-01-15 NOTE — Progress Notes (Signed)
Paged Nelda Marseille MD regarding low BP 80s/50s.  Levophed started and is now at 42mcg/min.  Will add dobutamine at 2.5.  Will continue to monitor.

## 2019-01-15 NOTE — ED Notes (Signed)
Phone number of person driving the car with pt (unknown relationship/name- from EMS) [phone]

## 2019-01-16 ENCOUNTER — Inpatient Hospital Stay (HOSPITAL_COMMUNITY): Payer: BLUE CROSS/BLUE SHIELD

## 2019-01-16 DIAGNOSIS — R570 Cardiogenic shock: Secondary | ICD-10-CM

## 2019-01-16 DIAGNOSIS — J189 Pneumonia, unspecified organism: Secondary | ICD-10-CM

## 2019-01-16 LAB — POCT I-STAT 7, (LYTES, BLD GAS, ICA,H+H)
Acid-Base Excess: 2 mmol/L (ref 0.0–2.0)
Bicarbonate: 25.2 mmol/L (ref 20.0–28.0)
Calcium, Ion: 1.18 mmol/L (ref 1.15–1.40)
HCT: 42 % (ref 36.0–46.0)
Hemoglobin: 14.3 g/dL (ref 12.0–15.0)
O2 Saturation: 98 %
Patient temperature: 98
Potassium: 3.1 mmol/L — ABNORMAL LOW (ref 3.5–5.1)
Sodium: 150 mmol/L — ABNORMAL HIGH (ref 135–145)
TCO2: 26 mmol/L (ref 22–32)
pCO2 arterial: 33.3 mmHg (ref 32.0–48.0)
pH, Arterial: 7.485 — ABNORMAL HIGH (ref 7.350–7.450)
pO2, Arterial: 101 mmHg (ref 83.0–108.0)

## 2019-01-16 LAB — COOXEMETRY PANEL
Carboxyhemoglobin: 1.1 % (ref 0.5–1.5)
Methemoglobin: 0.8 % (ref 0.0–1.5)
O2 Saturation: 74.8 %
Total hemoglobin: 13 g/dL (ref 12.0–16.0)

## 2019-01-16 LAB — BASIC METABOLIC PANEL
Anion gap: 11 (ref 5–15)
BUN: 31 mg/dL — ABNORMAL HIGH (ref 8–23)
CO2: 23 mmol/L (ref 22–32)
Calcium: 8.6 mg/dL — ABNORMAL LOW (ref 8.9–10.3)
Chloride: 113 mmol/L — ABNORMAL HIGH (ref 98–111)
Creatinine, Ser: 2.16 mg/dL — ABNORMAL HIGH (ref 0.44–1.00)
GFR calc Af Amer: 27 mL/min — ABNORMAL LOW (ref >60)
GFR calc non Af Amer: 23 mL/min — ABNORMAL LOW (ref >60)
Glucose, Bld: 144 mg/dL — ABNORMAL HIGH (ref 70–99)
Potassium: 3.2 mmol/L — ABNORMAL LOW (ref 3.5–5.1)
Sodium: 147 mmol/L — ABNORMAL HIGH (ref 135–145)

## 2019-01-16 LAB — GLUCOSE, CAPILLARY
Glucose-Capillary: 102 mg/dL — ABNORMAL HIGH (ref 70–99)
Glucose-Capillary: 124 mg/dL — ABNORMAL HIGH (ref 70–99)
Glucose-Capillary: 111 mg/dL — ABNORMAL HIGH (ref 70–99)
Glucose-Capillary: 111 mg/dL — ABNORMAL HIGH (ref 70–99)
Glucose-Capillary: 140 mg/dL — ABNORMAL HIGH (ref 70–99)

## 2019-01-16 LAB — CBC
HCT: 44.3 % (ref 36.0–46.0)
Hemoglobin: 13.2 g/dL (ref 12.0–15.0)
MCH: 26.6 pg (ref 26.0–34.0)
MCHC: 29.8 g/dL — ABNORMAL LOW (ref 30.0–36.0)
MCV: 89.3 fL (ref 80.0–100.0)
Platelets: 99 10*3/uL — ABNORMAL LOW (ref 150–400)
RBC: 4.96 MIL/uL (ref 3.87–5.11)
RDW: 16.8 % — ABNORMAL HIGH (ref 11.5–15.5)
WBC: 15.7 10*3/uL — ABNORMAL HIGH (ref 4.0–10.5)
nRBC: 0 % (ref 0.0–0.2)

## 2019-01-16 LAB — PHOSPHORUS
Phosphorus: 3.1 mg/dL (ref 2.5–4.6)
Phosphorus: 2.5 mg/dL (ref 2.5–4.6)

## 2019-01-16 LAB — PROCALCITONIN: Procalcitonin: 34.58 ng/mL

## 2019-01-16 LAB — T4, FREE: Free T4: 1.97 ng/dL — ABNORMAL HIGH (ref 0.61–1.12)

## 2019-01-16 LAB — HIV ANTIBODY (ROUTINE TESTING W REFLEX): HIV Screen 4th Generation wRfx: NONREACTIVE

## 2019-01-16 LAB — MAGNESIUM: Magnesium: 2 mg/dL (ref 1.7–2.4)

## 2019-01-16 MED ORDER — CHLORHEXIDINE GLUCONATE CLOTH 2 % EX PADS
6.0000 | MEDICATED_PAD | Freq: Every day | CUTANEOUS | Status: DC
  Administered 2019-01-16 – 2019-01-19 (×4): 6 via TOPICAL

## 2019-01-16 MED ORDER — PRO-STAT SUGAR FREE PO LIQD
30.0000 mL | Freq: Two times a day (BID) | ORAL | Status: DC
  Administered 2019-01-16 – 2019-01-17 (×3): 30 mL
  Filled 2019-01-16 (×3): qty 30

## 2019-01-16 MED ORDER — POTASSIUM CHLORIDE 20 MEQ/15ML (10%) PO SOLN
40.0000 meq | Freq: Once | ORAL | Status: AC
  Administered 2019-01-16: 08:00:00 40 meq
  Filled 2019-01-16: qty 30

## 2019-01-16 MED ORDER — SODIUM CHLORIDE 0.9% FLUSH
10.0000 mL | Freq: Two times a day (BID) | INTRAVENOUS | Status: DC
  Administered 2019-01-16 – 2019-01-19 (×7): 10 mL

## 2019-01-16 MED ORDER — VITAL AF 1.2 CAL PO LIQD: 1000.0000 mL | ORAL | Status: DC

## 2019-01-16 MED ORDER — POTASSIUM CHLORIDE 20 MEQ/15ML (10%) PO SOLN
40.0000 meq | Freq: Once | ORAL | Status: AC
  Administered 2019-01-16: 12:00:00 40 meq
  Filled 2019-01-16: qty 30

## 2019-01-16 MED ORDER — SODIUM CHLORIDE 0.9% FLUSH: 10.0000 mL | INTRAVENOUS | Status: DC | PRN

## 2019-01-16 MED ORDER — POTASSIUM CHLORIDE 20 MEQ/15ML (10%) PO SOLN: 40.0000 meq | Freq: Once | ORAL | Status: DC

## 2019-01-16 NOTE — Progress Notes (Signed)
Initial Nutrition Assessment  DOCUMENTATION CODES:   Not applicable  INTERVENTION:   Tube feeding:  -Vital AF 1.2 @ 55 ml/hr via OGT (1320 ml) -30 ml Prostat BID  Provides: 1784 kcals, 129 grams protein, 1071 ml free water. Meets 100% of needs.   NUTRITION DIAGNOSIS:   Inadequate oral intake related to inability to eat as evidenced by NPO status.  GOAL:   Patient will meet greater than or equal to 90% of their needs  MONITOR:   Diet advancement, Vent status, Skin, TF tolerance, Weight trends, Labs, I & O's  REASON FOR ASSESSMENT:   Ventilator    ASSESSMENT:   Patient with PMH significant for CHF, CKD, DM, dyslipidemia, mitral regurgitation, and OSA. Presents this admission with acute exacerbation CHF.   RD working remotely.  Pt with pulmonary edema. Requiring pressor support. Propofol off. Continues to diurese per cardiology. Discussed plan with CCM, okay to start feeding today. Possible extubation tomorrow.   Admission weight: 100 kg (stated?) Current weight: 86 kg  Patient is currently intubated on ventilator support MV: 8.1 L/min Temp (24hrs), Avg:101.2 F (38.4 C), Min:98.9 F (37.2 C), Max:103.8 F (39.9 C)  I/O: -1,916 ml since admit UOP: 1,840 ml x 24 hrs   Drips: dobutamine, levophed Medications: SS novolog Labs: Na 147 (H) K 3.2 (L)    Diet Order:   Diet Order            Diet NPO time specified  Diet effective now              EDUCATION NEEDS:   Not appropriate for education at this time  Skin:  Skin Assessment: Reviewed RN Assessment  Last BM:  7/14  Height:   Ht Readings from Last 1 Encounters:  01/15/19 5\' 5"  (1.651 m)    Weight:   Wt Readings from Last 1 Encounters:  01/16/19 86 kg    Ideal Body Weight:  56.8 kg  BMI:  Body mass index is 31.55 kg/m.  Estimated Nutritional Needs:   Kcal:  1689 kcal  Protein:  120-140 grams  Fluid:  >/= 1.7 L/day   Mariana Single RD, LDN Clinical Nutrition Pager # -  [phone]

## 2019-01-16 NOTE — Progress Notes (Addendum)
NAME:  Veronica Burke, MRN:  [id number], DOB:  September 05, 1955, LOS: 1 ADMISSION DATE:  01/15/2019, CONSULTATION DATE:  7/13 REFERRING MD:  Dr. Gilford Raid EDP, CHIEF COMPLAINT:  CHF   Brief History   63 year old female with advanced CHF on milrinone infusion chronically presented with hypoxia and respiratory difficulty requiring intubation.   History of present illness   63 year old female with past medical history as below, which is significant for systolic congestive heart failure with EF 20 to 25% on home milrinone infusion and chronic kidney disease.  This is managed at [facility]. She declined bed/heart transplant based on multiple factors and was referred to [facility] but declined due to insurance reasons.  She had 2 recent admissions for episodes of flash pulmonary edema and is now status post MitraClip in June of this year.  After discharge she has been doing well and had increase her exercise tolerance significantly.  She was most recently seen by her cardiologist on 01/04/2019 were overall encouraged with her recent performance and increase hydralazine.   7/13 she was driving in the car through [place] when she developed respiratory distress and presented to [facility] ED. She was profoundly hypoxic on initial exam and was intubated for respiratory insufficiency. Imaging consistent with pulmonary edema. Laboratory evaluation significant for.   Past Medical History   has a past medical history of ASD (atrial septal defect), Chronic systolic CHF (congestive heart failure) ([place]), CKD (chronic kidney disease), stage III ([place]), DM (diabetes mellitus) ([place]), Dyslipidemia, Essential hypertension, Hypothyroid, ICD (implantable cardioverter-defibrillator) in place, Mitral regurgitation, NICM (nonischemic cardiomyopathy) ([place]), NSVT (nonsustained ventricular tachycardia) ([place]), Obesity, OSA (obstructive sleep apnea), and S/P mitral valve clip implantation.  Significant Hospital Events    7/13 admit  Consults:  Cardiology  Procedures:    Significant Diagnostic Tests:   Echo 7/2: Left ventricular systolic function is severely reduced.LV ejection fraction = 20-25%.There is severe global hypokinesis of the left ventricle.The right ventricle is mildly dilated.The right ventricular systolic function is normal. There is moderate posteriorly directed mitral regurgitation. Moderate pulmonary hypertension.  RHC 11/2018 Right IJ venous access Right radial arterial access PASP - 75mmHg PAWP - 48mmHg Widely patent coronaries EBL<15ml  Pressures   RA 9 mmHg/9 mmHg/8 mmHg  RV 34 mmHg/-1 mmHg/4 mmHg  PA 35 mmHg/21 mmHg/17 mmHg  PCW 18 mmHg/20 mmHg/18 mmHg  LA / /  LV 115 mmHg/6 mmHg/19 mmHg  AO 119 mmHg/66 mmHg/86 mmHg  BP 108/68   Derived Parameters   SVR 2000  SVR Index 3637.9   Cardiac Output   Fick A-V O2 Diff 52.4 mL/L  Fick O2 Consumption  Fick Predicted O2 Consumption 163.5 mL/min  Fick CO 3.12 bpm  Fick CI 1.72  Thermo CO  Thermo CI   Micro Data:  7/13 blood > 7/13 urine > 7/13 tracheal >  Antimicrobials:  Cefepime 7/13 > Vancomycin 7/13 >  Interim history/subjective:  No acute events overnight.  Objective   Blood pressure 115/76, pulse 65, temperature (!) 100.6 F (38.1 C), temperature source Core, resp. rate (!) 8, height 5\' 5"  (1.651 m), weight 86 kg, SpO2 100 %. CVP:  [0 mmHg-37 mmHg] 37 mmHg  Vent Mode: PRVC FiO2 (%):  [50 %-100 %] 50 % Set Rate:  [18 bmp] 18 bmp Vt Set:  [490 mL] 490 mL PEEP:  [5 cmH20] 5 cmH20 Plateau Pressure:  [22 cmH20-30 cmH20] 24 cmH20   Intake/Output Summary (Last 24 hours) at 01/16/2019 0859 Last data filed at 01/16/2019 0800 Gross per  24 hour  Intake 864.85 ml  Output 1915 ml  Net -1050.15 ml   Filed Weights   01/15/19 1015 01/16/19 0610  Weight: 100 kg 86 kg    Examination: General: elderly appearing female on vent. HENT: [place]/AT, PERRL, mild JVP elevation Lungs: Clear bilateral breath  sounds Cardiovascular: RRR, no MRG Abdomen: Soft, non-tender, non-distended Extremities: RUE PICC, no edema.  Neuro: awake, alert, seemingly oriented.   Resolved Hospital Problem list     Assessment & Plan:   Acute on chronic HFrEF: LVEF 20-25% recently. On home milrinone infusion 0.125. She was under consideration for LVAD/transplant, but was removed from consideration.  - 1 L neg for admission so far.  - Pressors/inotorpes per advanced heart failure service (dobutamine) - Milrinone off - Diuresis per cardiology - Holding home carvedilol, hydralazine, isosorbide, torsemide with hypotension - Continue amiodarone.   Acute hypoxemic respiratory failure: pulmonary edema. Now some concern for PNA given fevers and elevated PCT. Now WBC up.  - Vent - Wean today, hopeful for extubation. - Propofol infusion, PRN fentanyl for RASS 0 to -2 - Follow pan cultures - Cefepime and vanco as above.   Shock: likely septic shock due to HCAP, however, cannot rule out a component of cardiogenic shock given her history.   Acute on CKD: creatinine 2.36, recent baseline 1.76. Marginally improved 7/14. Hypernateramia - Follow BMP - Has apparently declined renal transplant in the past as well - Diuresis per cards.   Hypokalemia - replaced orally 7/14  DM - CBG monitoring and SSI - Holding victoza, januvia  Metabolic acidosis: elevated anion gap. Lactic 6.6 on admit.  - Trend lactic with WOB improved and BP control.   Hypothyroid - TSH low, synthroid dose may need outpatient adjustment.  - Check T4 - IV synthroid  Best practice:  Diet:  NPO Pain/Anxiety/Delirium protocol (if indicated): Propofol VAP protocol (if indicated): Yes DVT prophylaxis: SQH GI prophylaxis: PPI Glucose control: SSI Mobility: BR Code Status: FULL Family Communication: Daughter and Husband updated. Full Code Disposition: ICU  Labs   CBC: Recent Labs  Lab 01/15/19 1013 01/15/19 1134 01/15/19 1321 01/16/19  0347 01/16/19 0500  WBC 8.1  --   --   --  15.7*  NEUTROABS 4.5  --   --   --   --   HGB 13.8 14.3 14.6 14.3 13.2  HCT 48.0* 42.0 43.0 42.0 44.3  MCV 92.0  --   --   --  89.3  PLT 136*  --   --   --  99*    Basic Metabolic Panel: Recent Labs  Lab 01/15/19 1013 01/15/19 1134 01/15/19 1321 01/15/19 1520 01/16/19 0347 01/16/19 0500  NA 145 147* 149*  --  150* 147*  K 4.6 3.7 3.7  --  3.1* 3.2*  CL 108  --   --   --   --  113*  CO2 18*  --   --   --   --  23  GLUCOSE 279*  --   --   --   --  144*  BUN 33*  --   --   --   --  31*  CREATININE 2.36*  --   --   --   --  2.16*  CALCIUM 9.0  --   --   --   --  8.6*  MG  --   --   --  2.3  --  2.0  PHOS  --   --   --  4.5  --  3.1   GFR: Estimated Creatinine Clearance: 28.5 mL/min (A) (by C-G formula based on SCr of 2.16 mg/dL (H)). Recent Labs  Lab 01/15/19 1013 01/15/19 1014 01/15/19 1520 01/15/19 2059 01/16/19 0500  PROCALCITON  --   --   --  13.90 34.58  WBC 8.1  --   --   --  15.7*  LATICACIDVEN  --  6.6* 1.2  --   --     Liver Function Tests: No results for input(s): AST, ALT, ALKPHOS, BILITOT, PROT, ALBUMIN in the last 168 hours. No results for input(s): LIPASE, AMYLASE in the last 168 hours. No results for input(s): AMMONIA in the last 168 hours.  ABG    Component Value Date/Time   PHART 7.485 (H) 01/16/2019 0347   PCO2ART 33.3 01/16/2019 0347   PO2ART 101.0 01/16/2019 0347   HCO3 25.2 01/16/2019 0347   TCO2 26 01/16/2019 0347   O2SAT 74.8 01/16/2019 0524     Coagulation Profile: No results for input(s): INR, PROTIME in the last 168 hours.  Cardiac Enzymes: No results for input(s): CKTOTAL, CKMB, CKMBINDEX, TROPONINI in the last 168 hours.  HbA1C: Hgb A1c MFr Bld  Date/Time Value Ref Range Status  01/15/2019 03:20 PM 5.0 4.8 - 5.6 % Final    Comment:    (NOTE) Pre diabetes:          5.7%-6.4% Diabetes:              >6.4% Glycemic control for   <7.0% adults with diabetes     CBG: Recent  Labs  Lab 01/15/19 2000 01/15/19 2110 01/15/19 2353 01/16/19 0347 01/16/19 0816  GLUCAP 116* 116* 97 102* 124*    Review of Systems:   Unable as patient is encephalopathic and intubated.   Past Medical History  She,  has a past medical history of ASD (atrial septal defect), Chronic systolic CHF (congestive heart failure) ([place]), CKD (chronic kidney disease), stage III ([place]), DM (diabetes mellitus) ([place]), Dyslipidemia, Essential hypertension, Hypothyroid, ICD (implantable cardioverter-defibrillator) in place, Mitral regurgitation, NICM (nonischemic cardiomyopathy) ([place]), NSVT (nonsustained ventricular tachycardia) ([place]), Obesity, OSA (obstructive sleep apnea), and S/P mitral valve clip implantation.   Surgical History     Social History      Family History   Her Family history is unknown by patient.   Allergies No Known Allergies   Home Medications  Prior to Admission medications   Medication Sig Start Date End Date Taking? Authorizing Provider  amiodarone (PACERONE) 200 MG tablet Take 200 mg by mouth daily. 12/18/18   [provider]  atorvastatin (LIPITOR) 40 MG tablet Take 40 mg by mouth daily. 12/13/18   [provider]  carvedilol (COREG) 6.25 MG tablet Take 6.25 mg by mouth 2 (two) times a day. 11/20/18   [provider]  clopidogrel (PLAVIX) 75 MG tablet Take 75 mg by mouth daily. 12/07/18   [provider]  FERREX 150 150 MG capsule Take 150 mg by mouth every Monday, Wednesday, and Friday. 01/04/19   [provider]  hydrALAZINE (APRESOLINE) 25 MG tablet Take 50 mg by mouth 3 (three) times daily. 01/04/19   [provider]  isosorbide dinitrate (ISORDIL) 20 MG tablet Take 20 mg by mouth 3 (three) times daily. 12/22/18   [provider]  JANUVIA 50 MG tablet Take 50 mg by mouth daily. 12/21/18   [provider]  LEVEMIR FLEXTOUCH 100 UNIT/ML Pen Inject 15 Units into the skin at bedtime. 12/13/18   [provider]  levothyroxine (SYNTHROID) 100 MCG tablet Take 100 mcg by mouth daily before breakfast. 12/22/18   [provider]  magnesium oxide (MAG-OX) 400 MG tablet Take 1 tablet by mouth 2 (two) times daily. 12/08/18   [provider]  torsemide (DEMADEX) 20 MG tablet Take 20 mg by mouth daily. 12/18/18   [provider]  VICTOZA 18 MG/3ML SOPN Inject 1.8 mg into the skin daily. 01/04/19   [provider]     Critical care time: 35 mins     Georgann Housekeeper, AGACNP-BC [place] Pager [phone] or [phone]  01/16/2019 8:59 AM  Attending Note:  63 year old female with advanced heart failure who presents to PCCM with respiratory failure.  Febrile overnight.  On exam, diminished BS diffusely.  I reviewed CXR myself, infiltrate noted.  Discussed with PCCM-NP.  Will start PS trials.  Continue cefepime and vanc.  F/U on cultures.  Advanced heart failure following.  PCCM will continue to follow.  The patient is critically ill with multiple organ systems failure and requires high complexity decision making for assessment and support, frequent evaluation and titration of therapies, application of advanced monitoring technologies and extensive interpretation of multiple databases.   Critical Care Time devoted to patient care services described in this note is  32  Minutes. This time reflects time of care of this signee Dr Jennet Maduro. This critical care time does not reflect procedure time, or teaching time or supervisory time of PA/NP/Med student/Med Resident etc but could involve care discussion time.  Rush Farmer, M.D. [facility] Pulmonary/Critical Care Medicine. Pager: [phone]. After hours pager: [phone].

## 2019-01-16 NOTE — Progress Notes (Signed)
Advanced Heart Failure Rounding Note   Subjective:    Awake on vent. Follows commands. Off NE. On dobutamine 2.5. Co-ox 75%. Did not diurese well with high-dose lasix last night  Spiked temp last night and with high PCT started on Vanc/cefipime.   Creatinine 2.4 -> 2.1  CVP not working well   Objective:   Weight Range:  Vital Signs:   Temp:  [96.7 F (35.9 C)-103.8 F (39.9 C)] 100.6 F (38.1 C) (07/14 0813) Pulse Rate:  [63-99] 65 (07/14 0800) Resp:  [4-38] 8 (07/14 0800) BP: (76-156)/(32-104) 115/76 (07/14 0800) SpO2:  [93 %-100 %] 100 % (07/14 0833) FiO2 (%):  [50 %-100 %] 50 % (07/14 0833) Weight:  [86 kg-100 kg] 86 kg (07/14 0610)    Weight change: Filed Weights   01/15/19 1015 01/16/19 0610  Weight: 100 kg 86 kg    Intake/Output:   Intake/Output Summary (Last 24 hours) at 01/16/2019 0901 Last data filed at 01/16/2019 0800 Gross per 24 hour  Intake 864.85 ml  Output 1915 ml  Net -1050.15 ml     Physical Exam: General:  Awake on vent. Follows all commands HEENT: normal + ETT Neck: supple. JVP looks to be 8-9 on exam . Carotids 2+ bilat; no bruits. No lymphadenopathy or thryomegaly appreciated. Cor: PMI nondisplaced. Regular rate & rhythm. No rubs, gallops or murmurs. Lungs: coarse Abdomen: soft, nontender, nondistended. No hepatosplenomegaly. No bruits or masses. Good bowel sounds. Extremities: no cyanosis, clubbing, rash, edema Neuro: awake on vent follow scommands  Telemetry: NSR 70s + PVCs Personally reviewed   Labs: Basic Metabolic Panel: Recent Labs  Lab 01/15/19 1013 01/15/19 1134 01/15/19 1321 01/15/19 1520 01/16/19 0347 01/16/19 0500  NA 145 147* 149*  --  150* 147*  K 4.6 3.7 3.7  --  3.1* 3.2*  CL 108  --   --   --   --  113*  CO2 18*  --   --   --   --  23  GLUCOSE 279*  --   --   --   --  144*  BUN 33*  --   --   --   --  31*  CREATININE 2.36*  --   --   --   --  2.16*  CALCIUM 9.0  --   --   --   --  8.6*  MG  --   --    --  2.3  --  2.0  PHOS  --   --   --  4.5  --  3.1    Liver Function Tests: No results for input(s): AST, ALT, ALKPHOS, BILITOT, PROT, ALBUMIN in the last 168 hours. No results for input(s): LIPASE, AMYLASE in the last 168 hours. No results for input(s): AMMONIA in the last 168 hours.  CBC: Recent Labs  Lab 01/15/19 1013 01/15/19 1134 01/15/19 1321 01/16/19 0347 01/16/19 0500  WBC 8.1  --   --   --  15.7*  NEUTROABS 4.5  --   --   --   --   HGB 13.8 14.3 14.6 14.3 13.2  HCT 48.0* 42.0 43.0 42.0 44.3  MCV 92.0  --   --   --  89.3  PLT 136*  --   --   --  99*    Cardiac Enzymes: No results for input(s): CKTOTAL, CKMB, CKMBINDEX, TROPONINI in the last 168 hours.  BNP: BNP (last 3 results) Recent Labs    01/15/19 1013  BNP  595.2*    ProBNP (last 3 results) No results for input(s): PROBNP in the last 8760 hours.    Other results:  Imaging: Dg Chest Port 1 View  Result Date: 01/16/2019 CLINICAL DATA:  Endotracheal tube placement.  Respiratory failure. EXAM: PORTABLE CHEST 1 VIEW COMPARISON:  Radiograph of January 15, 2019. FINDINGS: Stable cardiomediastinal silhouette. Endotracheal and nasogastric tubes are unchanged in position. Stable left-sided pacemaker. Stable right-sided PICC line. No pneumothorax is noted. Stable bilateral lung opacities concerning for pneumonia. Bony thorax is unremarkable. IMPRESSION: Stable support apparatus.  Stable bilateral lung opacities. Electronically Signed   By: Marijo Conception M.D.   On: 01/16/2019 07:29   Dg Chest Port 1 View  Result Date: 01/15/2019 CLINICAL DATA:  Endotracheal and orogastric tube placement. EXAM: PORTABLE CHEST 1 VIEW COMPARISON:  Chest CT June 29, 2018 FINDINGS: Endotracheal tube terminates 4 cm above the carina. Enteric catheter transverses the thorax and descends into the expected location of gastric body, tip collimated off the image. Single lead cardiac pacemaker in place. The cardiac silhouette is enlarged.  Mediastinal contours appear intact. Bilateral patchy alveolar and interstitial opacities with central predominance. No evidence of pneumothorax. Osseous structures are without acute abnormality. Soft tissues are grossly normal. IMPRESSION: 1. Support apparatus as described. 2. Bilateral patchy alveolar and interstitial opacities with central predominance. This may represent mixed pattern pulmonary edema or bilateral airspace consolidation. 3. Enlarged cardiac silhouette. Electronically Signed   By: Fidela Salisbury M.D.   On: 01/15/2019 10:40      Medications:     Scheduled Medications:  amiodarone  200 mg Per Tube Daily   aspirin  81 mg Per Tube Daily   chlorhexidine gluconate (MEDLINE KIT)  15 mL Mouth Rinse BID   Chlorhexidine Gluconate Cloth  6 each Topical Daily   clopidogrel  75 mg Per Tube Daily   heparin  5,000 Units Subcutaneous Q8H   insulin aspart  0-15 Units Subcutaneous Q4H   levothyroxine  50 mcg Intravenous Q0600   mouth rinse  15 mL Mouth Rinse 10 times per day   pantoprazole (PROTONIX) IV  40 mg Intravenous QHS   potassium chloride  40 mEq Per Tube Once   sodium chloride flush  10-40 mL Intracatheter Q12H     Infusions:  ceFEPime (MAXIPIME) IV Stopped (01/16/19 0003)   DOBUTamine 2.5 mcg/kg/min (01/16/19 0800)   furosemide     milrinone Stopped (01/15/19 2050)   norepinephrine (LEVOPHED) Adult infusion Stopped (01/16/19 0303)   propofol (DIPRIVAN) infusion 20 mcg/kg/min (01/15/19 1700)   vancomycin Stopped (01/16/19 0218)     PRN Medications:  acetaminophen (TYLENOL) oral liquid 160 mg/5 mL, fentaNYL (SUBLIMAZE) injection, fentaNYL (SUBLIMAZE) injection, hydrALAZINE, sodium chloride flush   Assessment/Plan:   1. Acute hypoxic respiratory failure with possible sepsis - initially though to be due to recurrent flash pulmonary edema however spikes temp and now PCT very elevated - suspect combination of HF/PNA.  - Vanc/cefipime started  7/13 - CX pending - On milrinone 0.125 at home. Now on dobutamine 2.5. NE off overnight - CXR not much improved today but clinically looks better - Co-ox 75% - If bcx + will need to remove PICC - Will give one dose IV lasix today - Vent wean per CCM - d/w them at the bedside. Appreciate their care.   2. Acute on chronic systolic HF - baseline EF 20-25% on home milrinone (recently decreased from 0.25 to 0.125) - s/p MitraClip in 12/06/18 - co-ox 75% on dobutamine 2.5 - CVP  reading not working. Clinically does not look markedly overlaoded - Will give 1 dose IV lasix today  3. AKI on CKD 3 - baseline creatinine 1.7-1.8 - improving overnight 2.4-> 2.1 - continue hemodynamic support  4. Moderate to severe MR - s/p MitraClip 12/06/18 with residual MR. Continue ASA/Plavix x 3 months  5. Hypokalemia - will supp   CRITICAL CARE Performed by: Glori Bickers  Total critical care time: 35 minutes  Critical care time was exclusive of separately billable procedures and treating other patients.  Critical care was necessary to treat or prevent imminent or life-threatening deterioration.  Critical care was time spent personally by me (independent of midlevel providers or residents) on the following activities: development of treatment plan with patient and/or surrogate as well as nursing, discussions with consultants, evaluation of patient's response to treatment, examination of patient, obtaining history from patient or surrogate, ordering and performing treatments and interventions, ordering and review of laboratory studies, ordering and review of radiographic studies, pulse oximetry and re-evaluation of patient's condition.   Length of Stay: 1   Glori Bickers MD 01/16/2019, 9:01 AM  Advanced Heart Failure Team Pager [phone] (M-F; [place])  Please contact [facility] for night-coverage after hours (4p -7a ) and weekends on [email or web address]

## 2019-01-17 ENCOUNTER — Inpatient Hospital Stay (HOSPITAL_COMMUNITY): Payer: BLUE CROSS/BLUE SHIELD

## 2019-01-17 LAB — CBC
HCT: 41.7 % (ref 36.0–46.0)
Hemoglobin: 12.3 g/dL (ref 12.0–15.0)
MCH: 26.8 pg (ref 26.0–34.0)
MCHC: 29.5 g/dL — ABNORMAL LOW (ref 30.0–36.0)
MCV: 90.8 fL (ref 80.0–100.0)
Platelets: 94 10*3/uL — ABNORMAL LOW (ref 150–400)
RBC: 4.59 MIL/uL (ref 3.87–5.11)
RDW: 17.1 % — ABNORMAL HIGH (ref 11.5–15.5)
WBC: 9.7 10*3/uL (ref 4.0–10.5)
nRBC: 0 % (ref 0.0–0.2)

## 2019-01-17 LAB — BASIC METABOLIC PANEL
Anion gap: 9 (ref 5–15)
BUN: 40 mg/dL — ABNORMAL HIGH (ref 8–23)
CO2: 24 mmol/L (ref 22–32)
Calcium: 8.6 mg/dL — ABNORMAL LOW (ref 8.9–10.3)
Chloride: 118 mmol/L — ABNORMAL HIGH (ref 98–111)
Creatinine, Ser: 2.31 mg/dL — ABNORMAL HIGH (ref 0.44–1.00)
GFR calc Af Amer: 25 mL/min — ABNORMAL LOW (ref >60)
GFR calc non Af Amer: 22 mL/min — ABNORMAL LOW (ref >60)
Glucose, Bld: 121 mg/dL — ABNORMAL HIGH (ref 70–99)
Potassium: 4 mmol/L (ref 3.5–5.1)
Sodium: 151 mmol/L — ABNORMAL HIGH (ref 135–145)

## 2019-01-17 LAB — GLUCOSE, CAPILLARY
Glucose-Capillary: 110 mg/dL — ABNORMAL HIGH (ref 70–99)
Glucose-Capillary: 115 mg/dL — ABNORMAL HIGH (ref 70–99)
Glucose-Capillary: 118 mg/dL — ABNORMAL HIGH (ref 70–99)
Glucose-Capillary: 122 mg/dL — ABNORMAL HIGH (ref 70–99)
Glucose-Capillary: 87 mg/dL (ref 70–99)
Glucose-Capillary: 92 mg/dL (ref 70–99)
Glucose-Capillary: 94 mg/dL (ref 70–99)

## 2019-01-17 LAB — URINE CULTURE: Culture: NO GROWTH

## 2019-01-17 LAB — PROCALCITONIN: Procalcitonin: 32.42 ng/mL

## 2019-01-17 LAB — PHOSPHORUS
Phosphorus: 2.4 mg/dL — ABNORMAL LOW (ref 2.5–4.6)
Phosphorus: 3 mg/dL (ref 2.5–4.6)

## 2019-01-17 LAB — COOXEMETRY PANEL
Carboxyhemoglobin: 1 % (ref 0.5–1.5)
Methemoglobin: 0.8 % (ref 0.0–1.5)
O2 Saturation: 70.8 %
Total hemoglobin: 12.4 g/dL (ref 12.0–16.0)

## 2019-01-17 LAB — T3, FREE: T3, Free: 1.6 pg/mL — ABNORMAL LOW (ref 2.0–4.4)

## 2019-01-17 LAB — T4, FREE: Free T4: 1.72 ng/dL — ABNORMAL HIGH (ref 0.61–1.12)

## 2019-01-17 LAB — MAGNESIUM: Magnesium: 2.1 mg/dL (ref 1.7–2.4)

## 2019-01-17 MED ORDER — PANTOPRAZOLE SODIUM 40 MG PO TBEC
40.0000 mg | DELAYED_RELEASE_TABLET | Freq: Every day | ORAL | Status: DC
  Administered 2019-01-18 – 2019-01-20 (×3): 40 mg via ORAL
  Filled 2019-01-17 (×3): qty 1

## 2019-01-17 MED ORDER — MILRINONE LACTATE IN DEXTROSE 20-5 MG/100ML-% IV SOLN
0.2500 ug/kg/min | INTRAVENOUS | Status: DC
  Administered 2019-01-17 – 2019-01-19 (×5): 0.25 ug/kg/min via INTRAVENOUS
  Filled 2019-01-17 (×5): qty 100

## 2019-01-17 MED ORDER — LEVOTHYROXINE SODIUM 100 MCG PO TABS
100.0000 ug | ORAL_TABLET | Freq: Every day | ORAL | Status: DC
  Administered 2019-01-18 – 2019-01-20 (×3): 100 ug via ORAL
  Filled 2019-01-17 (×3): qty 1

## 2019-01-17 MED ORDER — AMIODARONE HCL 200 MG PO TABS
200.0000 mg | ORAL_TABLET | Freq: Every day | ORAL | Status: DC
  Administered 2019-01-18 – 2019-01-20 (×3): 200 mg via ORAL
  Filled 2019-01-17 (×3): qty 1

## 2019-01-17 MED ORDER — CLOPIDOGREL BISULFATE 75 MG PO TABS
75.0000 mg | ORAL_TABLET | Freq: Every day | ORAL | Status: DC
  Administered 2019-01-18 – 2019-01-20 (×3): 75 mg via ORAL
  Filled 2019-01-17 (×3): qty 1

## 2019-01-17 MED ORDER — ASPIRIN 81 MG PO CHEW
81.0000 mg | CHEWABLE_TABLET | Freq: Every day | ORAL | Status: DC
  Administered 2019-01-18 – 2019-01-20 (×3): 81 mg via ORAL
  Filled 2019-01-17 (×4): qty 1

## 2019-01-17 NOTE — Procedures (Signed)
Extubation Procedure Note  Patient Details:   Name: Wava Kildow DOB: 20-May-1956 MRN: [id number]   Airway Documentation:    Vent end date: 01/17/19 Vent end time: 0958   Evaluation  O2 sats: stable throughout Complications: No apparent complications Patient did tolerate procedure well. Bilateral Breath Sounds: Clear   Yes   Patient was extubated to a 4L [place] without any complications, dyspnea or stridor noted. Patient was instructed on IS x 5, highest goal reached was 1,045mL. Positive cuff leak.   Claretta Fraise 01/17/2019, 9:58 AM

## 2019-01-17 NOTE — Progress Notes (Addendum)
NAME:  Veronica Burke, MRN:  [id number], DOB:  10-03-1955, LOS: 2 ADMISSION DATE:  01/15/2019, CONSULTATION DATE:  7/13 REFERRING MD:  Dr. Gilford Raid EDP, CHIEF COMPLAINT:  CHF   Brief History   63 year old female with advanced CHF on milrinone infusion chronically presented with hypoxia and respiratory difficulty requiring intubation. Initially felt to be volume overlaod, which may have played some role, but course proved to demonstrate HCAP.   Past Medical History   has a past medical history of ASD (atrial septal defect), Chronic systolic CHF (congestive heart failure) ([place]), CKD (chronic kidney disease), stage III ([place]), DM (diabetes mellitus) ([place]), Dyslipidemia, Essential hypertension, Hypothyroid, ICD (implantable cardioverter-defibrillator) in place, Mitral regurgitation, NICM (nonischemic cardiomyopathy) ([place]), NSVT (nonsustained ventricular tachycardia) ([place]), Obesity, OSA (obstructive sleep apnea), and S/P mitral valve clip implantation.  Significant Hospital Events   7/13 admit  Consults:  Cardiology  Procedures:    Significant Diagnostic Tests:   Echo 7/2: Left ventricular systolic function is severely reduced.LV ejection fraction = 20-25%.There is severe global hypokinesis of the left ventricle.The right ventricle is mildly dilated.The right ventricular systolic function is normal. There is moderate posteriorly directed mitral regurgitation. Moderate pulmonary hypertension.  RHC 11/2018 Right IJ venous access Right radial arterial access PASP - 31mmHg PAWP - 39mmHg Widely patent coronaries EBL<2ml  Pressures   RA 9 mmHg/9 mmHg/8 mmHg  RV 34 mmHg/-1 mmHg/4 mmHg  PA 35 mmHg/21 mmHg/17 mmHg  PCW 18 mmHg/20 mmHg/18 mmHg  LA / /  LV 115 mmHg/6 mmHg/19 mmHg  AO 119 mmHg/66 mmHg/86 mmHg  BP 108/68   Derived Parameters   SVR 2000  SVR Index 3637.9   Cardiac Output   Fick A-V O2 Diff 52.4 mL/L  Fick O2 Consumption  Fick Predicted O2 Consumption 163.5 mL/min   Fick CO 3.12 bpm  Fick CI 1.72  Thermo CO  Thermo CI   Micro Data:  7/13 blood > 7/13 urine > neg 7/13 tracheal >  Antimicrobials:  Cefepime 7/13 > Vancomycin 7/13 >  Interim history/subjective:  No acute events overnight. Weaning this AM.   Objective   Blood pressure (!) 141/77, pulse 71, temperature 99.1 F (37.3 C), resp. rate 19, height 5\' 5"  (1.651 m), weight 82.6 kg, SpO2 100 %. CVP:  [27 mmHg] 27 mmHg  Vent Mode: PSV;CPAP FiO2 (%):  [40 %] 40 % Set Rate:  [18 bmp] 18 bmp Vt Set:  [490 mL] 490 mL PEEP:  [5 cmH20] 5 cmH20 Pressure Support:  [5 cmH20-8 cmH20] 5 cmH20 Plateau Pressure:  [15 cmH20-20 cmH20] 19 cmH20   Intake/Output Summary (Last 24 hours) at 01/17/2019 0908 Last data filed at 01/17/2019 0900 Gross per 24 hour  Intake 851.95 ml  Output 2185 ml  Net -1333.05 ml   Filed Weights   01/15/19 1015 01/16/19 0610 01/17/19 0437  Weight: 100 kg 86 kg 82.6 kg    Examination:  General: elderly appearing female awake on vent HENT: [place]/AT, PERRL, no appreciable JVD Lungs: Clear bilateral breath sounds Cardiovascular: RRR, no MRG Abdomen: Soft, non-tender Extremities: RUE PICC, no edema Neuro: awake, alert, seemingly oriented  Resolved Hospital Problem list     Assessment & Plan:   Acute on chronic HFrEF: LVEF 20-25% recently. On home milrinone infusion 0.125. She was under consideration for LVAD/transplant, but was removed from consideration.  - 1 L neg for admission so far.  - Pressors/inotorpes per advanced heart failure service. Currently off - Home milrinone off - Diuresis per cardiology (2L output last 24 hours) -  Holding home carvedilol, hydralazine, isosorbide, torsemide with hypotension - Continue amiodarone.   Acute hypoxemic respiratory failure: pulmonary edema. Now concern for PNA given fevers and elevated PCT. Now WBC up.  - extubate today - Sedation off - Follow pan cultures - Cefepime and vanco as above. Continue. Follow cultures.   Acute on CKD: creatinine 2.36, recent baseline 1.76. Marginally improved 7/14. Hypernateramia - Follow BMP - Has apparently declined renal transplant in the past as well - Diuresis per cards.  - Sodium and creat up today. Would probably hold diuresis today.   Hypokalemia - replaced orally 7/14  DM - CBG monitoring and SSI - Holding victoza, januvia  Metabolic acidosis: elevated anion gap. Lactic 6.6 on admit.  - Trend lactic with WOB improved and BP control.   Hypothyroid - TSH low, synthroid dose may need outpatient adjustment.  - IV synthroid  Best practice:  Diet:  NPO Pain/Anxiety/Delirium protocol (if indicated): Propofol VAP protocol (if indicated): Yes DVT prophylaxis: SQH GI prophylaxis: PPI Glucose control: SSI Mobility: BR Code Status: FULL Family Communication: Message left for Daughter Jaala Disposition: ICU  Labs   CBC: Recent Labs  Lab 01/15/19 1013 01/15/19 1134 01/15/19 1321 01/16/19 0347 01/16/19 0500 01/17/19 0419  WBC 8.1  --   --   --  15.7* 9.7  NEUTROABS 4.5  --   --   --   --   --   HGB 13.8 14.3 14.6 14.3 13.2 12.3  HCT 48.0* 42.0 43.0 42.0 44.3 41.7  MCV 92.0  --   --   --  89.3 90.8  PLT 136*  --   --   --  99* 94*    Basic Metabolic Panel: Recent Labs  Lab 01/15/19 1013 01/15/19 1134 01/15/19 1321 01/15/19 1520 01/16/19 0347 01/16/19 0500 01/16/19 1630 01/17/19 0419  NA 145 147* 149*  --  150* 147*  --  151*  K 4.6 3.7 3.7  --  3.1* 3.2*  --  4.0  CL 108  --   --   --   --  113*  --  118*  CO2 18*  --   --   --   --  23  --  24  GLUCOSE 279*  --   --   --   --  144*  --  121*  BUN 33*  --   --   --   --  31*  --  40*  CREATININE 2.36*  --   --   --   --  2.16*  --  2.31*  CALCIUM 9.0  --   --   --   --  8.6*  --  8.6*  MG  --   --   --  2.3  --  2.0  --  2.1  PHOS  --   --   --  4.5  --  3.1 2.5 2.4*   GFR: Estimated Creatinine Clearance: 26.1 mL/min (A) (by C-G formula based on SCr of 2.31 mg/dL (H)). Recent Labs   Lab 01/15/19 1013 01/15/19 1014 01/15/19 1520 01/15/19 2059 01/16/19 0500 01/17/19 0419  PROCALCITON  --   --   --  13.90 34.58 32.42  WBC 8.1  --   --   --  15.7* 9.7  LATICACIDVEN  --  6.6* 1.2  --   --   --     Liver Function Tests: No results for input(s): AST, ALT, ALKPHOS, BILITOT, PROT, ALBUMIN in the last 168 hours. No  results for input(s): LIPASE, AMYLASE in the last 168 hours. No results for input(s): AMMONIA in the last 168 hours.  ABG    Component Value Date/Time   PHART 7.485 (H) 01/16/2019 0347   PCO2ART 33.3 01/16/2019 0347   PO2ART 101.0 01/16/2019 0347   HCO3 25.2 01/16/2019 0347   TCO2 26 01/16/2019 0347   O2SAT 70.8 01/17/2019 0442     Coagulation Profile: No results for input(s): INR, PROTIME in the last 168 hours.  Cardiac Enzymes: No results for input(s): CKTOTAL, CKMB, CKMBINDEX, TROPONINI in the last 168 hours.  HbA1C: Hgb A1c MFr Bld  Date/Time Value Ref Range Status  01/15/2019 03:20 PM 5.0 4.8 - 5.6 % Final    Comment:    (NOTE) Pre diabetes:          5.7%-6.4% Diabetes:              >6.4% Glycemic control for   <7.0% adults with diabetes     CBG: Recent Labs  Lab 01/16/19 1528 01/16/19 1948 01/17/19 0039 01/17/19 0433 01/17/19 0736  GLUCAP 111* 140* 115* 118* 122*    Review of Systems:   Unable as patient is encephalopathic and intubated.   Past Medical History  She,  has a past medical history of ASD (atrial septal defect), Chronic systolic CHF (congestive heart failure) ([place]), CKD (chronic kidney disease), stage III ([place]), DM (diabetes mellitus) ([place]), Dyslipidemia, Essential hypertension, Hypothyroid, ICD (implantable cardioverter-defibrillator) in place, Mitral regurgitation, NICM (nonischemic cardiomyopathy) ([place]), NSVT (nonsustained ventricular tachycardia) ([place]), Obesity, OSA (obstructive sleep apnea), and S/P mitral valve clip implantation.   Surgical History     Social History      Family History   Her Family  history is unknown by patient.   Allergies No Known Allergies   Home Medications  Prior to Admission medications   Medication Sig Start Date End Date Taking? Authorizing Provider  amiodarone (PACERONE) 200 MG tablet Take 200 mg by mouth daily. 12/18/18   [provider]  atorvastatin (LIPITOR) 40 MG tablet Take 40 mg by mouth daily. 12/13/18   [provider]  carvedilol (COREG) 6.25 MG tablet Take 6.25 mg by mouth 2 (two) times a day. 11/20/18   [provider]  clopidogrel (PLAVIX) 75 MG tablet Take 75 mg by mouth daily. 12/07/18   [provider]  FERREX 150 150 MG capsule Take 150 mg by mouth every Monday, Wednesday, and Friday. 01/04/19   [provider]  hydrALAZINE (APRESOLINE) 25 MG tablet Take 50 mg by mouth 3 (three) times daily. 01/04/19   [provider]  isosorbide dinitrate (ISORDIL) 20 MG tablet Take 20 mg by mouth 3 (three) times daily. 12/22/18   [provider]  JANUVIA 50 MG tablet Take 50 mg by mouth daily. 12/21/18   [provider]  LEVEMIR FLEXTOUCH 100 UNIT/ML Pen Inject 15 Units into the skin at bedtime. 12/13/18   [provider]  levothyroxine (SYNTHROID) 100 MCG tablet Take 100 mcg by mouth daily before breakfast. 12/22/18   [provider]  magnesium oxide (MAG-OX) 400 MG tablet Take 1 tablet by mouth 2 (two) times daily. 12/08/18   [provider]  torsemide (DEMADEX) 20 MG tablet Take 20 mg by mouth daily. 12/18/18   [provider]  VICTOZA 18 MG/3ML SOPN Inject 1.8 mg into the skin daily. 01/04/19   [provider]     Critical care time: 35 mins     Georgann Housekeeper, AGACNP-BC [place]  Pager [phone] or [phone]  01/17/2019 9:08 AM  Attending Note:  63 year old female with heart failure chronically on milrinone that overnight required levophed.  No further events overnight.  On exam, weaning well this AM with coarse BS  diffusely.  I reviewed CXR myself, pulmonary edema noted.  Discussed with PCCM-NP.  Will wean levophed to off.  Extubate patient today.  Titrate O2 for sat of 88-92%.  IS and flutter valve.  OOB to chair.  D/C norepi.  Dobutamine instead of milrinone.  Discussed with PCCM-NP.  The patient is critically ill with multiple organ systems failure and requires high complexity decision making for assessment and support, frequent evaluation and titration of therapies, application of advanced monitoring technologies and extensive interpretation of multiple databases.   Critical Care Time devoted to patient care services described in this note is  32  Minutes. This time reflects time of care of this signee Dr Jennet Maduro. This critical care time does not reflect procedure time, or teaching time or supervisory time of PA/NP/Med student/Med Resident etc but could involve care discussion time.  Rush Farmer, M.D. [facility] Pulmonary/Critical Care Medicine. Pager: [phone]. After hours pager: [phone].

## 2019-01-17 NOTE — Progress Notes (Signed)
Advanced Heart Failure Rounding Note   Subjective:    Extubated this am. Feels Ok. Throat a bit sore. Denies SOB, orthopnea or PND. Tmax overnight 102.2   Off pressors but remains on dobutamine 2.5 for HF support. Co-ox 71%. CVP line not working.   Cx NGTD. PCT coming down slowly 34.6 -> 32.4   Objective:   Weight Range:  Vital Signs:   Temp:  [98.6 F (37 C)-102.2 F (39 C)] 99.5 F (37.5 C) (07/15 1600) Pulse Rate:  [55-121] 65 (07/15 1600) Resp:  [0-27] 23 (07/15 1600) BP: (91-152)/(58-111) 102/58 (07/15 1600) SpO2:  [98 %-100 %] 99 % (07/15 1600) FiO2 (%):  [40 %] 40 % (07/15 0830) Weight:  [82.6 kg] 82.6 kg (07/15 0437) Last BM Date: 01/17/19  Weight change: Filed Weights   01/15/19 1015 01/16/19 0610 01/17/19 0437  Weight: 100 kg 86 kg 82.6 kg    Intake/Output:   Intake/Output Summary (Last 24 hours) at 01/17/2019 1710 Last data filed at 01/17/2019 1600 Gross per 24 hour  Intake 726.77 ml  Output 1005 ml  Net -278.23 ml     Physical Exam: General:  Weak appearing. Hoarse No resp difficulty HEENT: normal Neck: supple. JVP 8-9. Carotids 2+ bilat; no bruits. No lymphadenopathy or thryomegaly appreciated. Cor: PMI nondisplaced. Regular rate & rhythm. No rubs, gallops or murmurs. Lungs: basilar crackles  Abdomen: soft, nontender, nondistended. No hepatosplenomegaly. No bruits or masses. Good bowel sounds. Extremities: no cyanosis, clubbing, rash, edema Neuro: alert & orientedx3, cranial nerves grossly intact. moves all 4 extremities w/o difficulty. Affect pleasant   Telemetry: NSR 60-70s + PVCs Personally reviewed   Labs: Basic Metabolic Panel: Recent Labs  Lab 01/15/19 1013 01/15/19 1134 01/15/19 1321 01/15/19 1520 01/16/19 0347 01/16/19 0500 01/16/19 1630 01/17/19 0419  NA 145 147* 149*  --  150* 147*  --  151*  K 4.6 3.7 3.7  --  3.1* 3.2*  --  4.0  CL 108  --   --   --   --  113*  --  118*  CO2 18*  --   --   --   --  23  --  24   GLUCOSE 279*  --   --   --   --  144*  --  121*  BUN 33*  --   --   --   --  31*  --  40*  CREATININE 2.36*  --   --   --   --  2.16*  --  2.31*  CALCIUM 9.0  --   --   --   --  8.6*  --  8.6*  MG  --   --   --  2.3  --  2.0  --  2.1  PHOS  --   --   --  4.5  --  3.1 2.5 2.4*    Liver Function Tests: No results for input(s): AST, ALT, ALKPHOS, BILITOT, PROT, ALBUMIN in the last 168 hours. No results for input(s): LIPASE, AMYLASE in the last 168 hours. No results for input(s): AMMONIA in the last 168 hours.  CBC: Recent Labs  Lab 01/15/19 1013 01/15/19 1134 01/15/19 1321 01/16/19 0347 01/16/19 0500 01/17/19 0419  WBC 8.1  --   --   --  15.7* 9.7  NEUTROABS 4.5  --   --   --   --   --   HGB 13.8 14.3 14.6 14.3 13.2 12.3  HCT 48.0* 42.0 43.0 42.0  44.3 41.7  MCV 92.0  --   --   --  89.3 90.8  PLT 136*  --   --   --  99* 94*    Cardiac Enzymes: No results for input(s): CKTOTAL, CKMB, CKMBINDEX, TROPONINI in the last 168 hours.  BNP: BNP (last 3 results) Recent Labs    01/15/19 1013  BNP 595.2*    ProBNP (last 3 results) No results for input(s): PROBNP in the last 8760 hours.    Other results:  Imaging: Dg Chest Port 1 View  Result Date: 01/17/2019 CLINICAL DATA:  Hypoxia EXAM: PORTABLE CHEST 1 VIEW COMPARISON:  Yesterday FINDINGS: Endotracheal tube tip between the clavicular heads and carina. The orogastric tube at least reaches the stomach. Single chamber ICD/pacer into the right ventricle is stable. Right upper extremity PICC with tip at the SVC. Bilateral airspace disease with mild improvement. No effusion or pneumothorax. Cardiomegaly. IMPRESSION: 1. Stable hardware positioning. 2. Improved but still extensive airspace disease. Electronically Signed   By: Monte Fantasia M.D.   On: 01/17/2019 07:23   Dg Chest Port 1 View  Result Date: 01/16/2019 CLINICAL DATA:  Endotracheal tube placement.  Respiratory failure. EXAM: PORTABLE CHEST 1 VIEW COMPARISON:  Radiograph  of January 15, 2019. FINDINGS: Stable cardiomediastinal silhouette. Endotracheal and nasogastric tubes are unchanged in position. Stable left-sided pacemaker. Stable right-sided PICC line. No pneumothorax is noted. Stable bilateral lung opacities concerning for pneumonia. Bony thorax is unremarkable. IMPRESSION: Stable support apparatus.  Stable bilateral lung opacities. Electronically Signed   By: Marijo Conception M.D.   On: 01/16/2019 07:29     Medications:     Scheduled Medications: . [START ON 01/18/2019] amiodarone  200 mg Oral Daily  . [START ON 01/18/2019] aspirin  81 mg Oral Daily  . Chlorhexidine Gluconate Cloth  6 each Topical Daily  . [START ON 01/18/2019] clopidogrel  75 mg Oral Daily  . feeding supplement (PRO-STAT SUGAR FREE 64)  30 mL Per Tube BID  . heparin  5,000 Units Subcutaneous Q8H  . insulin aspart  0-15 Units Subcutaneous Q4H  . [START ON 01/18/2019] levothyroxine  100 mcg Oral Q0600  . [START ON 01/18/2019] pantoprazole  40 mg Oral Daily  . sodium chloride flush  10-40 mL Intracatheter Q12H    Infusions: . ceFEPime (MAXIPIME) IV Stopped (01/16/19 2254)  . feeding supplement (VITAL AF 1.2 CAL)    . milrinone 0.25 mcg/kg/min (01/17/19 1600)  . norepinephrine (LEVOPHED) Adult infusion Stopped (01/16/19 0303)  . vancomycin Stopped (01/17/19 0040)    PRN Medications: acetaminophen (TYLENOL) oral liquid 160 mg/5 mL, fentaNYL (SUBLIMAZE) injection, fentaNYL (SUBLIMAZE) injection, hydrALAZINE, sodium chloride flush   Assessment/Plan:   1. Acute hypoxic respiratory failure with possible sepsis - initially though to be due to recurrent flash pulmonary edema however spikes temp and now PCT very elevated - suspect combination of HF/PNA.  - Vanc/cefipime started 7/13 - CX remain NGTD - On milrinone 0.125 at home. Now on dobutamine 2.5. NE off - CXR improving slowly  Personally reviewed - Co-ox 71%  BP stable. Switch dobutamine back to home milrinone at 0.25 - Volume status  looks ok. Creatinine up a bit. Hold lasix today  2. Acute on chronic systolic HF - baseline EF 20-25% on home milrinone (recently decreased from 0.25 to 0.125) - s/p MitraClip in 12/06/18 - co-ox 71% on dobutamine 2.5. as above, switch back to home milrinone - CVP reading not working. Clinically does not look markedly overlaoded - Hold lasix today  3. AKI on CKD 3 - baseline creatinine 1.7-1.8 - overnight 2.4-> 2.1 -> 2.3 - continue hemodynamic support. Hold lasix  4. Moderate to severe MR - s/p MitraClip 12/06/18 with residual MR. Continue ASA/Plavix x 3 months  5. Hypokalemia - resolved  Length of Stay: 2   Glori Bickers MD 01/17/2019, 5:10 PM  Advanced Heart Failure Team Pager [phone] (M-F; 7a - 4p)  Please contact [facility] for night-coverage after hours (4p -7a ) and weekends on [email or web address]

## 2019-01-17 NOTE — Progress Notes (Signed)
[place] Progress Note Patient Name: Axelle Szwed DOB: Dec 26, 1955 MRN: [id number]   Date of Service  01/17/2019  HPI/Events of Note  Pt was extubated within the past 24 hours and is requesting a diet  eICU Interventions  Clear liquid diet ordered with advancement to cardiac diet as tolerated, fluids restricted to 1200 ml Q 24 hours        Melana Hingle U Shawniece Oyola 01/17/2019, 10:29 PM

## 2019-01-18 ENCOUNTER — Other Ambulatory Visit: Payer: Self-pay

## 2019-01-18 ENCOUNTER — Encounter (HOSPITAL_COMMUNITY): Payer: Self-pay | Admitting: *Deleted

## 2019-01-18 DIAGNOSIS — J9601 Acute respiratory failure with hypoxia: Secondary | ICD-10-CM

## 2019-01-18 LAB — CBC
HCT: 38.4 % (ref 36.0–46.0)
Hemoglobin: 11 g/dL — ABNORMAL LOW (ref 12.0–15.0)
MCH: 26.3 pg (ref 26.0–34.0)
MCHC: 28.6 g/dL — ABNORMAL LOW (ref 30.0–36.0)
MCV: 91.6 fL (ref 80.0–100.0)
Platelets: 94 10*3/uL — ABNORMAL LOW (ref 150–400)
RBC: 4.19 MIL/uL (ref 3.87–5.11)
RDW: 17 % — ABNORMAL HIGH (ref 11.5–15.5)
WBC: 8.6 10*3/uL (ref 4.0–10.5)
nRBC: 0 % (ref 0.0–0.2)

## 2019-01-18 LAB — GLUCOSE, CAPILLARY
Glucose-Capillary: 88 mg/dL (ref 70–99)
Glucose-Capillary: 87 mg/dL (ref 70–99)
Glucose-Capillary: 111 mg/dL — ABNORMAL HIGH (ref 70–99)
Glucose-Capillary: 66 mg/dL — ABNORMAL LOW (ref 70–99)
Glucose-Capillary: 110 mg/dL — ABNORMAL HIGH (ref 70–99)

## 2019-01-18 LAB — BASIC METABOLIC PANEL
Anion gap: 10 (ref 5–15)
BUN: 40 mg/dL — ABNORMAL HIGH (ref 8–23)
CO2: 22 mmol/L (ref 22–32)
Calcium: 8.8 mg/dL — ABNORMAL LOW (ref 8.9–10.3)
Chloride: 118 mmol/L — ABNORMAL HIGH (ref 98–111)
Creatinine, Ser: 2.04 mg/dL — ABNORMAL HIGH (ref 0.44–1.00)
GFR calc Af Amer: 29 mL/min — ABNORMAL LOW (ref >60)
GFR calc non Af Amer: 25 mL/min — ABNORMAL LOW (ref >60)
Glucose, Bld: 88 mg/dL (ref 70–99)
Potassium: 4.8 mmol/L (ref 3.5–5.1)
Sodium: 150 mmol/L — ABNORMAL HIGH (ref 135–145)

## 2019-01-18 LAB — PHOSPHORUS: Phosphorus: 3.7 mg/dL (ref 2.5–4.6)

## 2019-01-18 LAB — CULTURE, RESPIRATORY W GRAM STAIN: Culture: NORMAL

## 2019-01-18 LAB — COOXEMETRY PANEL
Carboxyhemoglobin: 0.9 % (ref 0.5–1.5)
Methemoglobin: 0.8 % (ref 0.0–1.5)
O2 Saturation: 78.5 %
Total hemoglobin: 13 g/dL (ref 12.0–16.0)

## 2019-01-18 LAB — MAGNESIUM: Magnesium: 2.1 mg/dL (ref 1.7–2.4)

## 2019-01-18 MED ORDER — INSULIN ASPART 100 UNIT/ML ~~LOC~~ SOLN: 0.0000 [IU] | Freq: Three times a day (TID) | SUBCUTANEOUS | Status: DC

## 2019-01-18 NOTE — Progress Notes (Addendum)
NAME:  Veronica Burke, MRN:  [id number], DOB:  [birth year], LOS: 3 ADMISSION DATE:  01/15/2019, CONSULTATION DATE:  7/13 REFERRING MD:  Dr. Gilford Raid EDP, CHIEF COMPLAINT:  CHF   Brief History   63 year old female with advanced CHF on milrinone infusion chronically presented 01/15/19 with hypoxia and respiratory difficulty requiring intubation. Initially felt to be volume overlaod, which may have played some role, but course proved to demonstrate HCAP.   Past Medical History   has a past medical history of ASD (atrial septal defect), Chronic systolic CHF (congestive heart failure) ([place]), CKD (chronic kidney disease), stage III ([place]), DM (diabetes mellitus) ([place]), Dyslipidemia, Essential hypertension, Hypothyroid, ICD (implantable cardioverter-defibrillator) in place, Mitral regurgitation, NICM (nonischemic cardiomyopathy) ([place]), NSVT (nonsustained ventricular tachycardia) ([place]), Obesity, OSA (obstructive sleep apnea), and S/P mitral valve clip implantation.  Significant Hospital Events   7/13 admit  Consults:  Adv HF Cardiology  Procedures:  ETI 7/13>>7/15  Significant Diagnostic Tests:   Echo 7/2: Left ventricular systolic function is severely reduced.LV ejection fraction = 20-25%.There is severe global hypokinesis of the left ventricle.The right ventricle is mildly dilated.The right ventricular systolic function is normal. There is moderate posteriorly directed mitral regurgitation. Moderate pulmonary hypertension.  RHC 11/2018 Right IJ venous access Right radial arterial access PASP - 1mmHg PAWP - 75mmHg Widely patent coronaries EBL<86ml  Pressures   RA 9 mmHg/9 mmHg/8 mmHg  RV 34 mmHg/-1 mmHg/4 mmHg  PA 35 mmHg/21 mmHg/17 mmHg  PCW 18 mmHg/20 mmHg/18 mmHg  LA / /  LV 115 mmHg/6 mmHg/19 mmHg  AO 119 mmHg/66 mmHg/86 mmHg  BP 108/68   Derived Parameters   SVR 2000  SVR Index 3637.9   Cardiac Output   Fick A-V O2 Diff 52.4 mL/L  Fick O2 Consumption  Fick  Predicted O2 Consumption 163.5 mL/min  Fick CO 3.12 bpm  Fick CI 1.72  Thermo CO  Thermo CI   Micro Data:  7/13 blood >>NGTD 7/13 urine > neg 7/13 tracheal >>GPC 7/13 MRSA surveillance negative  7/13 SARS CoV 2 negative   Antimicrobials:  Cefepime 7/13 > Vancomycin 7/13 >7/15  Interim history/subjective:  Extubated yesterday. Taking PO today. No SOB.  Talking on phone this am. States she feels much better.   Objective   Blood pressure 116/67, pulse (!) 59, temperature 98.2 F (36.8 C), resp. rate (!) 21, height 5\' 5"  (1.651 m), weight 81.4 kg, SpO2 100 %.        Intake/Output Summary (Last 24 hours) at 01/18/2019 0948 Last data filed at 01/18/2019 0900 Gross per 24 hour  Intake 791.5 ml  Output 1170 ml  Net -378.5 ml   Filed Weights   01/16/19 0610 01/17/19 0437 01/18/19 0500  Weight: 86 kg 82.6 kg 81.4 kg    Examination:  General: well developed, well nourished, sitting up in bed talking on phone HENT: Normocephalic. PERRL. MMM Neck: No JVD Lungs: BBS present, clear, FNL, symmetrical  Cardiovascular: RRR, no MRG Abdomen: + BS x4. SNT/ND Extremities: RUE PICC, no edema Neuro: A&Ox4. CN II-XII grossly in tact   Resolved Hospital Problem list   Acute hypoxemic respiratory failure Hypokalemia  Metabolic acidosis  Assessment & Plan:   Acute on chronic HFrEF: LVEF 20-25% recently. On home milrinone infusion 0.125. She was under consideration for LVAD/transplant, but was removed from consideration.  - 2.7 L neg for admission Plan - Home milrinone per advanced HF service  - Diuresis per cardiology-on hold for today - Continue to hold home carvedilol, hydralazine, isosorbide, torsemide-mgt per cardiology -  Continue amiodarone.   Acute hypoxemic respiratory failure: Resolved.  Likely combination of pulmonary edema and PNA given fever, elevated PCT, leukocytosis Plan. - extubated, doing well  - IS, bronchial hygiene  - Follow pan cultures -De-escalate to  Cefepime and D/c vanc. Follow cultures. -PO abx 7/17  Acute on CKD: creatinine 2.04, slowly improving with good UO (recent baseline Crea 1.76). Hypernateramia. Na essentially "flat" - Follow BMP - Has apparently declined renal transplant in the past as well - Holding Diuresis per cards.    Hypokalemia-resolved   DM Plan - she has not required SSI coverage so far -change to achs - resume home regimen when appropriate  Metabolic acidosis: resolved  Hypothyroid - TSH low, synthroid dose may need outpatient adjustment.  - resume PO dose  Best practice:  Diet:  ADAT Pain/Anxiety/Delirium protocol (if indicated): not indicated VAP protocol (if indicated): NA DVT prophylaxis: heparin GI prophylaxis: PPI Glucose control: SSI Mobility: PT Code Status: FULL Family Communication: will update Disposition: Pt may transfer to floor. D/W Dr Cathlean Sauer, [facility] to assume care 7/17.  PCCM will sign off. Thank you for the opportunity to participate in this patient's care. Please contact if we can be of further assistance.  Francine Graven, MSN, AGACNP  Pager [phone] or if no answer [phone] [facility]   Labs: reviewed in EMR   Attending Note:  63 year old female with extensive cardiac history that is chronically on milrinone who presents to PCCM with respiratory failure due to PNA and pulmonary edema.  Extubated yesterday and has done well.  On exam, alert and interactive, moving all ext to command.  I reviewed CXR myself, infiltrate noted.  Discussed with PCCM-NP.  Acute respiratory failure: extubated  - Monitor for airway protection  Hypoxemia:  - Titrate O2 for sat of 88-92%  - Will need a ambulatory desaturation study prior to discharge for ?home O2  Cardiogenic shock:  - Milrinone restarted  - D/Ced dobutamine  CKD:  - BMET in AM  - Replace electrolytes as indicated  - Avoid nephrotoxic drugs  Hypernatremia:  - Hold lasix  PNA:  - D/C  vanc  - Continue cefepime  - F/U on culture  Transfer out of the ICU and to [facility] service with PCCM off 7/17  Patient seen and examined, agree with above note.  I dictated the care and orders written for this patient under my direction.  Rush Farmer, [place]

## 2019-01-18 NOTE — Evaluation (Signed)
Physical Therapy Evaluation Patient Details Name: Veronica Burke MRN: [id number] DOB: [birth year] Today's Date: 01/18/2019   History of Present Illness  63 year old female with advanced CHF on milrinone infusion chronically presented 01/15/19 with hypoxia and respiratory difficulty requiring intubation. Initially felt to be volume overlaod, which may have played some role, but course proved to demonstrate HCAP.  Her PMH positive for ASD, Chronic systolic CHF, CKD stage III, DM, Dyslipidemia, Essential hypertension, Hypothyroid, ICD, Mitral regurgitation, NICM, NSVT, Obesity, OSA  and S/P mitral valve clip implantation.  Clinical Impression  Patient presents with decreased mobility due to decreased activity tolerance with new O2 requirement.  She lives with family with multiple steps to enter and will benefit from skilled PT in the acute setting to progress mobility for safe d/c home with family support and likely no follow up PT needs.     Follow Up Recommendations No PT follow up    Equipment Recommendations  None recommended by PT    Recommendations for Other Services       Precautions / Restrictions Precautions Precautions: Fall Precaution Comments: new O2 dependent      Mobility  Bed Mobility               General bed mobility comments: up standing in room upon my entry with OT in room  Transfers Overall transfer level: Needs assistance   Transfers: Sit to/from Stand Sit to Stand: Supervision         General transfer comment: cues for safety and assist for lines  Ambulation/Gait Ambulation/Gait assistance: Supervision;Min guard   Assistive device: None Gait Pattern/deviations: Step-through pattern;Decreased stride length     General Gait Details: good pace, no LOB, on 3L O2 throughout with VSS  Stairs            Wheelchair Mobility    Modified Rankin (Stroke Patients Only)       Balance Overall balance assessment: Mild deficits observed, not  formally tested                                           Pertinent Vitals/Pain Pain Assessment: No/denies pain    Home Living Family/patient expects to be discharged to:: Private residence Living Arrangements: Spouse/significant other;Children Available Help at Discharge: Family;Available 24 hours/day Type of Home: House Home Access: Stairs to enter Entrance Stairs-Rails: Right;Left;Can reach both Entrance Stairs-Number of Steps: 3 + 5 + 5 Home Layout: Two level;Able to live on main level with bedroom/bathroom Home Equipment: Walker - 4 wheels;Cane - single point;Grab bars - tub/shower Additional Comments: spouse is legally blind.  Daughter lives upstairs and works from home     Prior Function Level of Independence: Needs assistance      ADL's / Homemaking Assistance Needed: Pt able to complete ADLs mod I.  She requires occasional assist for meal prep.  She drive short distances, and is able to grocery shop independently         [place]        Extremity/Trunk Assessment   Upper Extremity Assessment Upper Extremity Assessment: Defer to OT evaluation    Lower Extremity Assessment Lower Extremity Assessment: Overall WFL for tasks assessed       Communication   Communication: No difficulties  Cognition Arousal/Alertness: Awake/alert Behavior During Therapy: WFL for tasks assessed/performed Overall Cognitive Status: Within Functional Limits for tasks assessed  General Comments General comments (skin integrity, edema, etc.): SpO2 on 3L O2 with ambulation 90 or above, with OT prior to mobility out of the room dropped to 83% on RA    Exercises     Assessment/Plan    PT Assessment Patient needs continued PT services  PT Problem List Decreased mobility;Cardiopulmonary status limiting activity;Decreased activity tolerance       PT Treatment Interventions Gait training;Stair  training;Therapeutic activities;Therapeutic exercise;Patient/family education    PT Goals (Current goals can be found in the Care Plan section)  Acute Rehab PT Goals Patient Stated Goal: to return to independent PT Goal Formulation: With patient Time For Goal Achievement: 01/25/19 Potential to Achieve Goals: Good    Frequency Min 3X/week   Barriers to discharge        Co-evaluation               AM-PAC PT "6 Clicks" Mobility  Outcome Measure Help needed turning from your back to your side while in a flat bed without using bedrails?: A Little Help needed moving from lying on your back to sitting on the side of a flat bed without using bedrails?: A Little Help needed moving to and from a bed to a chair (including a wheelchair)?: A Little Help needed standing up from a chair using your arms (e.g., wheelchair or bedside chair)?: A Little Help needed to walk in hospital room?: A Little Help needed climbing 3-5 steps with a railing? : A Little 6 Click Score: 18    End of Session Equipment Utilized During Treatment: Gait belt;Oxygen Activity Tolerance: Patient tolerated treatment well Patient left: in chair;with call bell/phone within reach Nurse Communication: Mobility status PT Visit Diagnosis: Other abnormalities of gait and mobility (R26.89)    Time: 1212-1226 PT Time Calculation (min) (ACUTE ONLY): 14 min   Charges:   PT Evaluation $PT Eval Low Complexity: [place], [name] Acute Rehabilitation Services [phone] 01/18/2019   Reginia Naas 01/18/2019, 4:46 PM

## 2019-01-18 NOTE — Progress Notes (Signed)
Nutrition Follow-up  DOCUMENTATION CODES:   Not applicable  INTERVENTION:    Encourage PO intake  Snacks BID  NUTRITION DIAGNOSIS:   Inadequate oral intake related to inability to eat as evidenced by NPO status.  Diet advanced   GOAL:   Patient will meet greater than or equal to 90% of their needs  Progressing  MONITOR:   Diet advancement, Vent status, Skin, TF tolerance, Weight trends, Labs, I & O's  REASON FOR ASSESSMENT:   Ventilator    ASSESSMENT:   Patient with PMH significant for CHF, CKD, DM, dyslipidemia, mitral regurgitation, and OSA. Presents this admission with acute exacerbation CHF.   7/15- extubated   Spoke with pt via phone. Tolerating clear liquids well- having beef broth for lunch. Diet advanced to heart healthy this afternoon. Discussed the importance of protein intake while admitted. Pt does not wish to have supplementation but willing to try snacks in between meals.   PTA- pt was eating great. Followed a fluid restriction, watched her salt/carb intake. Denies any unintentional wt loss.   Admission weight: 86 kg Current weight: 81.4 kg  I/O: -2,756 ml since admit UOP: 1,150 ml x 24 hrs   Drips: milrinone  Medications: SS novolog Labs: Na 150 (H) Cr 2.04- trending down  Diet Order:   Diet Order            Diet Heart Room service appropriate? Yes; Fluid consistency: Thin  Diet effective now              EDUCATION NEEDS:   Not appropriate for education at this time  Skin:  Skin Assessment: Reviewed RN Assessment  Last BM:  7/15  Height:   Ht Readings from Last 1 Encounters:  01/15/19 5\' 5"  (1.651 m)    Weight:   Wt Readings from Last 1 Encounters:  01/18/19 81.4 kg    Ideal Body Weight:  56.8 kg  BMI:  Body mass index is 29.86 kg/m.  Estimated Nutritional Needs:   Kcal:  1700-1900 kcal  Protein:  85-100 grams  Fluid:  >/= 1.7 L/day   Mariana Single RD, LDN Clinical Nutrition Pager # - [phone]

## 2019-01-18 NOTE — Progress Notes (Signed)
Report given to 2C RN 

## 2019-01-18 NOTE — Progress Notes (Signed)
Pharmacy Antibiotic Note  Veronica Burke is a 63 y.o. female admitted on 01/15/2019 with shock requiring intubated for airway protection. Pt is on home inotropes, concern for sepsis with ongoing line placement, pharmacy asked to start vancomycin and cefepime. Cultures negative, MRSA PCR negative, renal function stable.  Plan: Stop vancomycin Continue cefepime 2g IV q24h today Switch to oral antibiotics tomorrow   Height: 5\' 5"  (165.1 cm) Weight: 179 lb 7.3 oz (81.4 kg) IBW/kg (Calculated) : 57  Temp (24hrs), Avg:98.8 F (37.1 C), Min:98 F (36.7 C), Max:99.7 F (37.6 C)  Recent Labs  Lab 01/15/19 1013 01/15/19 1014 01/15/19 1520 01/16/19 0500 01/17/19 0419 01/18/19 0354 01/18/19 0542  WBC 8.1  --   --  15.7* 9.7  --  8.6  CREATININE 2.36*  --   --  2.16* 2.31* 2.04*  --   LATICACIDVEN  --  6.6* 1.2  --   --   --   --     Estimated Creatinine Clearance: 29.4 mL/min (A) (by C-G formula based on SCr of 2.04 mg/dL (H)).    No Known Allergies  Antimicrobials this admission: Vancomycin 7/13 >>7/15 Cefepime 7/13 >>    Microbiology results: 7/13 BCx: NGTD 7/13 UCx: negative 7/13 TA: normal flora 7/13 MRSA: negative   Arrie Senate, PharmD, BCPS Clinical Pharmacist [phone] Please check AMION for all [place] numbers 01/18/2019

## 2019-01-18 NOTE — Progress Notes (Signed)
Advanced Heart Failure Rounding Note   Subjective:    Much improved. Off vent x 24 hours. Denies SOB, orthopnea or PND. AF overnight.   Now on milrinone 0.25 (home inotorpes). Co-ox 78%  Objective:   Weight Range:  Vital Signs:   Temp:  [98 F (36.7 C)-99.7 F (37.6 C)] 98.1 F (36.7 C) (07/16 0800) Pulse Rate:  [54-121] 54 (07/16 0800) Resp:  [11-25] 25 (07/16 0800) BP: (90-152)/(50-111) 109/63 (07/16 0800) SpO2:  [98 %-100 %] 100 % (07/16 0800) Weight:  [81.4 kg] 81.4 kg (07/16 0500) Last BM Date: 01/17/19  Weight change: Filed Weights   01/16/19 0610 01/17/19 0437 01/18/19 0500  Weight: 86 kg 82.6 kg 81.4 kg    Intake/Output:   Intake/Output Summary (Last 24 hours) at 01/18/2019 0847 Last data filed at 01/18/2019 0800 Gross per 24 hour  Intake 849.05 ml  Output 1215 ml  Net -365.95 ml     Physical Exam: General:  Weak appearing. No resp difficulty HEENT: normal Neck: supple. JVP 6. Carotids 2+ bilat; no bruits. No lymphadenopathy or thryomegaly appreciated. Cor: PMI nondisplaced. Regular rate & rhythm. No rubs, gallops or murmurs. Lungs: clear Abdomen: soft, nontender, nondistended. No hepatosplenomegaly. No bruits or masses. Good bowel sounds. Extremities: no cyanosis, clubbing, rash, edema Neuro: alert & orientedx3, cranial nerves grossly intact. moves all 4 extremities w/o difficulty. Affect pleasant    Telemetry: NSR 50-60s Personally reviewed   Labs: Basic Metabolic Panel: Recent Labs  Lab 01/15/19 1013  01/15/19 1321  01/15/19 1520 01/16/19 0347 01/16/19 0500 01/16/19 1630 01/17/19 0419 01/17/19 1700 01/18/19 0354  NA 145   < > 149*  --   --  150* 147*  --  151*  --  150*  K 4.6   < > 3.7  --   --  3.1* 3.2*  --  4.0  --  4.8  CL 108  --   --   --   --   --  113*  --  118*  --  118*  CO2 18*  --   --   --   --   --  23  --  24  --  22  GLUCOSE 279*  --   --   --   --   --  144*  --  121*  --  88  BUN 33*  --   --   --   --   --   31*  --  40*  --  40*  CREATININE 2.36*  --   --   --   --   --  2.16*  --  2.31*  --  2.04*  CALCIUM 9.0  --   --   --   --   --  8.6*  --  8.6*  --  8.8*  MG  --   --   --   --  2.3  --  2.0  --  2.1  --  2.1  PHOS  --   --   --    < > 4.5  --  3.1 2.5 2.4* 3.0 3.7   < > = values in this interval not displayed.    Liver Function Tests: No results for input(s): AST, ALT, ALKPHOS, BILITOT, PROT, ALBUMIN in the last 168 hours. No results for input(s): LIPASE, AMYLASE in the last 168 hours. No results for input(s): AMMONIA in the last 168 hours.  CBC: Recent Labs  Lab 01/15/19 1013  01/15/19  1321 01/16/19 0347 01/16/19 0500 01/17/19 0419 01/18/19 0542  WBC 8.1  --   --   --  15.7* 9.7 8.6  NEUTROABS 4.5  --   --   --   --   --   --   HGB 13.8   < > 14.6 14.3 13.2 12.3 11.0*  HCT 48.0*   < > 43.0 42.0 44.3 41.7 38.4  MCV 92.0  --   --   --  89.3 90.8 91.6  PLT 136*  --   --   --  99* 94* 94*   < > = values in this interval not displayed.    Cardiac Enzymes: No results for input(s): CKTOTAL, CKMB, CKMBINDEX, TROPONINI in the last 168 hours.  BNP: BNP (last 3 results) Recent Labs    01/15/19 1013  BNP 595.2*    ProBNP (last 3 results) No results for input(s): PROBNP in the last 8760 hours.    Other results:  Imaging: Dg Chest Port 1 View  Result Date: 01/17/2019 CLINICAL DATA:  Hypoxia EXAM: PORTABLE CHEST 1 VIEW COMPARISON:  Yesterday FINDINGS: Endotracheal tube tip between the clavicular heads and carina. The orogastric tube at least reaches the stomach. Single chamber ICD/pacer into the right ventricle is stable. Right upper extremity PICC with tip at the SVC. Bilateral airspace disease with mild improvement. No effusion or pneumothorax. Cardiomegaly. IMPRESSION: 1. Stable hardware positioning. 2. Improved but still extensive airspace disease. Electronically Signed   By: Monte Fantasia M.D.   On: 01/17/2019 07:23     Medications:     Scheduled Medications: .  amiodarone  200 mg Oral Daily  . aspirin  81 mg Oral Daily  . Chlorhexidine Gluconate Cloth  6 each Topical Daily  . clopidogrel  75 mg Oral Daily  . feeding supplement (PRO-STAT SUGAR FREE 64)  30 mL Per Tube BID  . heparin  5,000 Units Subcutaneous Q8H  . insulin aspart  0-15 Units Subcutaneous Q4H  . levothyroxine  100 mcg Oral Q0600  . pantoprazole  40 mg Oral Daily  . sodium chloride flush  10-40 mL Intracatheter Q12H    Infusions: . ceFEPime (MAXIPIME) IV Stopped (01/17/19 2258)  . feeding supplement (VITAL AF 1.2 CAL)    . milrinone 0.25 mcg/kg/min (01/18/19 0800)  . norepinephrine (LEVOPHED) Adult infusion Stopped (01/16/19 0303)  . vancomycin Stopped (01/18/19 0044)    PRN Medications: acetaminophen (TYLENOL) oral liquid 160 mg/5 mL, fentaNYL (SUBLIMAZE) injection, fentaNYL (SUBLIMAZE) injection, hydrALAZINE, sodium chloride flush   Assessment/Plan:   1. Acute hypoxic respiratory failure with possible sepsis - initially though to be due to recurrent flash pulmonary edema however spikes temp and now PCT very elevated - suspect combination of HF/PNA.  - Vanc/cefipime started 7/13 - CX remain NGTD. MRSA swab negative. Will stop vanc. Continue cefipime. Will need 7 days abx. Can switch to po tomorrow - Now back on home milrinone 0.25. Co-ox 78% - Volume status looks dry. Continue to hold lasix   2. Acute on chronic systolic HF - baseline EF 20-25% on home milrinone (recently decreased from 0.25 to 0.125) - s/p MitraClip in 12/06/18 - co-ox 78% on home milrinone - Volume status ok. Sodium up. Hold lasix today - No ACE/ARNI with CKD. No b-blocker with low output requiring inotropes.  3. AKI on CKD 3 - baseline creatinine 1.7-1.8 - overnight 2.4-> 2.1 -> 2.3 -> 2.0 - continue hemodynamic support. Hold lasix  4. Moderate to severe MR - s/p MitraClip 12/06/18 with residual  MR. Continue ASA/Plavix x 3 months  5. Hypokalemia - resolved  Likely can go to floor today. We  will follow along with primary team.   Length of Stay: 3   Glori Bickers MD 01/18/2019, 8:47 AM  Advanced Heart Failure Team Pager [phone] (M-F; 7a - 4p)  Please contact [facility] for night-coverage after hours (4p -7a ) and weekends on [email or web address]

## 2019-01-19 DIAGNOSIS — E039 Hypothyroidism, unspecified: Secondary | ICD-10-CM

## 2019-01-19 DIAGNOSIS — N183 Chronic kidney disease, stage 3 unspecified: Secondary | ICD-10-CM

## 2019-01-19 DIAGNOSIS — J9621 Acute and chronic respiratory failure with hypoxia: Secondary | ICD-10-CM

## 2019-01-19 DIAGNOSIS — E669 Obesity, unspecified: Secondary | ICD-10-CM

## 2019-01-19 DIAGNOSIS — E1122 Type 2 diabetes mellitus with diabetic chronic kidney disease: Secondary | ICD-10-CM

## 2019-01-19 LAB — MAGNESIUM: Magnesium: 2.1 mg/dL (ref 1.7–2.4)

## 2019-01-19 LAB — CBC
HCT: 34.6 % — ABNORMAL LOW (ref 36.0–46.0)
Hemoglobin: 10.1 g/dL — ABNORMAL LOW (ref 12.0–15.0)
MCH: 26.4 pg (ref 26.0–34.0)
MCHC: 29.2 g/dL — ABNORMAL LOW (ref 30.0–36.0)
MCV: 90.6 fL (ref 80.0–100.0)
Platelets: 90 10*3/uL — ABNORMAL LOW (ref 150–400)
RBC: 3.82 MIL/uL — ABNORMAL LOW (ref 3.87–5.11)
RDW: 16.5 % — ABNORMAL HIGH (ref 11.5–15.5)
WBC: 6.3 10*3/uL (ref 4.0–10.5)
nRBC: 0 % (ref 0.0–0.2)

## 2019-01-19 LAB — PHOSPHORUS: Phosphorus: 2.9 mg/dL (ref 2.5–4.6)

## 2019-01-19 LAB — BASIC METABOLIC PANEL
Anion gap: 3 — ABNORMAL LOW (ref 5–15)
BUN: 35 mg/dL — ABNORMAL HIGH (ref 8–23)
CO2: 25 mmol/L (ref 22–32)
Calcium: 8.3 mg/dL — ABNORMAL LOW (ref 8.9–10.3)
Chloride: 115 mmol/L — ABNORMAL HIGH (ref 98–111)
Creatinine, Ser: 1.73 mg/dL — ABNORMAL HIGH (ref 0.44–1.00)
GFR calc Af Amer: 36 mL/min — ABNORMAL LOW (ref >60)
GFR calc non Af Amer: 31 mL/min — ABNORMAL LOW (ref >60)
Glucose, Bld: 87 mg/dL (ref 70–99)
Potassium: 3.9 mmol/L (ref 3.5–5.1)
Sodium: 143 mmol/L (ref 135–145)

## 2019-01-19 LAB — GLUCOSE, CAPILLARY
Glucose-Capillary: 81 mg/dL (ref 70–99)
Glucose-Capillary: 90 mg/dL (ref 70–99)
Glucose-Capillary: 98 mg/dL (ref 70–99)
Glucose-Capillary: 105 mg/dL — ABNORMAL HIGH (ref 70–99)

## 2019-01-19 LAB — COOXEMETRY PANEL
Carboxyhemoglobin: 1.5 % (ref 0.5–1.5)
Methemoglobin: 1.2 % (ref 0.0–1.5)
O2 Saturation: 74.7 %
Total hemoglobin: 11 g/dL — ABNORMAL LOW (ref 12.0–16.0)

## 2019-01-19 MED ORDER — TORSEMIDE 20 MG PO TABS
20.0000 mg | ORAL_TABLET | Freq: Every day | ORAL | Status: DC
  Administered 2019-01-19 – 2019-01-20 (×2): 20 mg via ORAL
  Filled 2019-01-19 (×2): qty 1

## 2019-01-19 MED ORDER — POTASSIUM CHLORIDE CRYS ER 20 MEQ PO TBCR
40.0000 meq | EXTENDED_RELEASE_TABLET | Freq: Once | ORAL | Status: AC
  Administered 2019-01-19: 12:00:00 40 meq via ORAL
  Filled 2019-01-19: qty 2

## 2019-01-19 NOTE — Progress Notes (Signed)
Occupational Therapy Note  OT eval completed, full note to follow.  Will follow acutely, but no follow up OT or DME needs identified at discharge.    01/18/19 1400  OT Time Calculation  OT Start Time (ACUTE ONLY) 1139  OT Stop Time (ACUTE ONLY) 1214  OT Time Calculation (min) 35 min  OT General Charges  $OT Visit 1 Visit  OT Evaluation  $OT Eval Moderate Complexity 1 Mod  OT Treatments  $Self Care/Home Management  8-22 mins  Lucille Passy, OTR/L Acute Rehabilitation Services Pager [phone] Office [phone]

## 2019-01-19 NOTE — Progress Notes (Signed)
Advanced Heart Failure Rounding Note   Subjective:    Feels good this am. Working with her IS. No CP or SOB. Co-ox 75%  Objective:   Weight Range:  Vital Signs:   Temp:  [97.6 F (36.4 C)-98.2 F (36.8 C)] 98 F (36.7 C) (07/17 0703) Pulse Rate:  [56-117] 60 (07/17 0703) Resp:  [15-22] 18 (07/17 0325) BP: (107-138)/(51-84) 110/60 (07/17 0703) SpO2:  [97 %-100 %] 97 % (07/17 0703) Last BM Date: 01/18/19  Weight change: Filed Weights   01/16/19 0610 01/17/19 0437 01/18/19 0500  Weight: 86 kg 82.6 kg 81.4 kg    Intake/Output:   Intake/Output Summary (Last 24 hours) at 01/19/2019 1134 Last data filed at 01/19/2019 0900 Gross per 24 hour  Intake 442.16 ml  Output -  Net 442.16 ml     Physical Exam: General:  Well appearing. No resp difficulty HEENT: normal Neck: supple. JVP 6 Carotids 2+ bilat; no bruits. No lymphadenopathy or thryomegaly appreciated. Cor: PMI nondisplaced. Regular rate & rhythm. No rubs, gallops or murmurs. Lungs: clear Abdomen: soft, nontender, nondistended. No hepatosplenomegaly. No bruits or masses. Good bowel sounds. Extremities: no cyanosis, clubbing, rash, edema Neuro: alert & orientedx3, cranial nerves grossly intact. moves all 4 extremities w/o difficulty. Affect pleasant   Telemetry: NSR 50-60s 5-8 PVCs per mintue Personally reviewed   Labs: Basic Metabolic Panel: Recent Labs  Lab 01/15/19 1013  01/15/19 1520 01/16/19 0347 01/16/19 0500 01/16/19 1630 01/17/19 0419 01/17/19 1700 01/18/19 0354 01/19/19 0245  NA 145   < >  --  150* 147*  --  151*  --  150* 143  K 4.6   < >  --  3.1* 3.2*  --  4.0  --  4.8 3.9  CL 108  --   --   --  113*  --  118*  --  118* 115*  CO2 18*  --   --   --  23  --  24  --  22 25  GLUCOSE 279*  --   --   --  144*  --  121*  --  88 87  BUN 33*  --   --   --  31*  --  40*  --  40* 35*  CREATININE 2.36*  --   --   --  2.16*  --  2.31*  --  2.04* 1.73*  CALCIUM 9.0  --   --   --  8.6*  --  8.6*  --   8.8* 8.3*  MG  --   --  2.3  --  2.0  --  2.1  --  2.1 2.1  PHOS  --    < > 4.5  --  3.1 2.5 2.4* 3.0 3.7 2.9   < > = values in this interval not displayed.    Liver Function Tests: No results for input(s): AST, ALT, ALKPHOS, BILITOT, PROT, ALBUMIN in the last 168 hours. No results for input(s): LIPASE, AMYLASE in the last 168 hours. No results for input(s): AMMONIA in the last 168 hours.  CBC: Recent Labs  Lab 01/15/19 1013  01/16/19 0347 01/16/19 0500 01/17/19 0419 01/18/19 0542 01/19/19 0245  WBC 8.1  --   --  15.7* 9.7 8.6 6.3  NEUTROABS 4.5  --   --   --   --   --   --   HGB 13.8   < > 14.3 13.2 12.3 11.0* 10.1*  HCT 48.0*   < > 42.0  44.3 41.7 38.4 34.6*  MCV 92.0  --   --  89.3 90.8 91.6 90.6  PLT 136*  --   --  99* 94* 94* 90*   < > = values in this interval not displayed.    Cardiac Enzymes: No results for input(s): CKTOTAL, CKMB, CKMBINDEX, TROPONINI in the last 168 hours.  BNP: BNP (last 3 results) Recent Labs    01/15/19 1013  BNP 595.2*    ProBNP (last 3 results) No results for input(s): PROBNP in the last 8760 hours.    Other results:  Imaging: No results found.   Medications:     Scheduled Medications: . amiodarone  200 mg Oral Daily  . aspirin  81 mg Oral Daily  . Chlorhexidine Gluconate Cloth  6 each Topical Daily  . clopidogrel  75 mg Oral Daily  . heparin  5,000 Units Subcutaneous Q8H  . insulin aspart  0-15 Units Subcutaneous TID AC & HS  . levothyroxine  100 mcg Oral Q0600  . pantoprazole  40 mg Oral Daily  . potassium chloride  40 mEq Oral Once  . sodium chloride flush  10-40 mL Intracatheter Q12H  . torsemide  20 mg Oral Daily    Infusions: . ceFEPime (MAXIPIME) IV Stopped (01/19/19 0048)  . milrinone 0.25 mcg/kg/min (01/19/19 0620)    PRN Medications: hydrALAZINE, sodium chloride flush   Assessment/Plan:   1. Acute hypoxic respiratory failure with possible sepsis - initially though to be due to recurrent flash  pulmonary edema however spikes temp and now PCT very elevated - suspect combination of HF/PNA.  - Vanc/cefipime started 7/13 - CX remain NGTD. MRSA swab negative. Will stop vanc. Continue cefipime. Will need 7 days abx. Can switch to po augmentin today to complete 7 days - Now back on home milrinone 0.25. Co-ox 75% - Volume status looks  Good. Resume home torsemide 20 daily    2. Acute on chronic systolic HF - baseline EF 20-25% on home milrinone (recently decreased from 0.25 to 0.125) - s/p MitraClip in 12/06/18 - co-ox 75% on home milrinone - Volume status ok. Sodium improved. Resume torsemide - No ACE/ARNI with CKD. No b-blocker with low output requiring inotropes.  3. AKI on CKD 3 - baseline creatinine 1.7-1.8 - overnight 2.4-> 2.1 -> 2.3 -> 2.0 -> 1.73 - continue hemodynamic support. Restart torsemide  4. Moderate to severe MR - s/p MitraClip 12/06/18 with residual MR. Continue ASA/Plavix x 3 months  5. Hypokalemia - resolved  6. Frequent PVCs - averaging 5-10% per minute. Continue to follow. If burden increases may need to suppress.   Likely can go home tomorrow. I called her primary HF cardiologist (Dr. Cyndia Skeeters and updated him today) . He will see her in f/u in 7-10 days.   HF team will sign off. Please call with questions.   D/c meds  Milrinone 0.25 mcg/kgmin Torsemide 20 daily ASA 81 Plavix 75 Imdur 20 tid Hydralazine 25 tid Atorva 40  Amio 200 daily Synthroid Mag ox  Hold carvedilol for now.    Length of Stay: 4   Glori Bickers MD 01/19/2019, 11:34 AM  Advanced Heart Failure Team Pager [phone] (M-F; [place])  Please contact [facility] for night-coverage after hours (4p -7a ) and weekends on [email or web address]

## 2019-01-19 NOTE — Progress Notes (Signed)
PROGRESS NOTE    Veronica Burke  IOX:[id number] DOB: Jul 14, 1955 DOA: 01/15/2019 PCP: Patient, No Pcp Per    Brief Narrative:  63 year old female who presented with dyspnea.  She does have significant past medical history for systolic heart failure, ejection fraction 20 to 25% with chronic milrinone infusion, chronic kidney disease, type 2 diabetes mellitus, hypothyroid, dyslipidemia and obstructive sleep apnea.  She apparently developed acute onset of severe dyspnea, associated with increased work of breathing, she was found profoundly hypoxic in the emergency department and was immediately intubated and placed on  mechanical ventilation.  On her initial physical examination blood pressure 86/63, heart rate 70, temperature 96.7, respiratory rate 16, oxygen saturation 100% on 100% FiO2 and a PEEP of 5.  Her lungs are clear bilaterally, heart S1-S2 present and rhythmic, abdomen was soft nontender, positive lower extremity edema. Sodium 145, potassium 4.6, chloride 108, bicarb 18, glucose 279, BUN 33, creatinine 2.36, anion gap 19, BNP 595, white count 8.1, hemoglobin 13.8, hematocrit 48.0, platelets 136, SARS COVID-19 was negative, urinalysis 11-20 white cells, 0-5 red cells, specific gravity 1.018, protein 300.  Chest radiograph with mild left rotation, positive cardiomegaly, bilateral interstitial and alveolar infiltrates.  Positive AICD, PICC line in place.  EKG 94 bpm, right axis deviation, sinus rhythm with a right bundle branch block, J-point elevation V1 through V5.  Significant T wave changes.  Patient was admitted to the hospital with working diagnosis of acute hypoxic respiratory failure due to acute cardiogenic pulmonary edema due to acute on chronic systolic heart failure exacerbation.   Assessment & Plan:   Principal Problem:   Acute on chronic respiratory failure with hypoxia (HCC) Active Problems:   Acute on chronic HFrEF (heart failure with reduced ejection fraction) (HCC)   Type 2  diabetes mellitus with stage 3 chronic kidney disease (HCC)   CKD stage 3 due to type 2 diabetes mellitus (HCC)   Hypothyroid   Obesity   1.  Acute hypoxic respiratory failure due to acute cardiogenic pulmonary edema/ acute on chronic systolic heart failure decompensation (LV ef 20 to 25%). Patient has negative fluid balance, since admission is -2,553. Her symptoms have improved but not yet back to baseline. Blood pressure 268 systolic on milrinone infusion.   Will continue blood pressure and fluid balance monitoring, resumed diuresis with torsemide. Continue holding b blockade and afterload reduction due to risk of hypotension. Continue amiodarone. On dual antiplatelet therapy with asa and clopidogrel.   2. Community acquired pneumonia, multilobar. Clinically patient is improving, cultures have been no growth and no leukocytosis. Patient had 5 doses of antibiotic, chest film personally reviewed with improvement of infiltrates, I suspect respiratory failure mainly due to pulmonary edema  predominant than infection, 5 doses will be sufficient duration of therapy.     3.  AKI on chronic kidney disease stage 3a with hypokalemia. Renal function with serum cr at 1,73 from 2,0, will continue close monitoring of renal function, patient has been placed on torsemide.  K is 3,9, continue K correction with Kcl.   4.  Type 2 diabetes mellitus. Fasting glucose this am 87, will continue to encourage po intake, will continue insulin sliding scale for glucose cover and monitoring.   5. Obesity. BMI is 29,8 will need outpatient follow up.   6. Hypothyroid. Continue with levothyroxine.   DVT prophylaxis: heparin   Code Status: full Family Communication: no family at the bedside  Disposition Plan/ discharge barriers: pending clinical improvement, possible dc in am.  Body mass index  is 29.86 kg/m.    RN Pressure Injury Documentation:     Consultants:   Cardiology   Procedures:      Antimicrobials:       Subjective: Patient is feeling better, her dyspnea continue to improve, not yet back to baseline, no chest pain or lower extremity edema.  Objective: Vitals:   01/19/19 0000 01/19/19 0011 01/19/19 0325 01/19/19 0703  BP: (!) 107/51  (!) 112/59 110/60  Pulse: 60  63 60  Resp: 19 19 18    Temp: 98 F (36.7 C)  97.6 F (36.4 C) 98 F (36.7 C)  TempSrc: Oral  Oral Oral  SpO2: 100% 98% 100% 97%  Weight:      Height:        Intake/Output Summary (Last 24 hours) at 01/19/2019 1121 Last data filed at 01/19/2019 0900 Gross per 24 hour  Intake 442.16 ml  Output -  Net 442.16 ml   Filed Weights   01/16/19 0610 01/17/19 0437 01/18/19 0500  Weight: 86 kg 82.6 kg 81.4 kg    Examination:   General: deconditioned  Neurology: Awake and alert, non focal  E ENT: mild pallor, no icterus, oral mucosa moist Cardiovascular: No JVD. S1-S2 present, rhythmic, no gallops, rubs, or murmurs. No lower extremity edema. Pulmonary: positive breath sounds bilaterally, positive air movement, no wheezing, no rhonchi or rales. Gastrointestinal. Abdomen with  no organomegaly, non tender, no rebound or guarding Skin. No rashes Musculoskeletal: no joint deformities     Data Reviewed: I have personally reviewed following labs and imaging studies  CBC: Recent Labs  Lab 01/15/19 1013  01/16/19 0347 01/16/19 0500 01/17/19 0419 01/18/19 0542 01/19/19 0245  WBC 8.1  --   --  15.7* 9.7 8.6 6.3  NEUTROABS 4.5  --   --   --   --   --   --   HGB 13.8   < > 14.3 13.2 12.3 11.0* 10.1*  HCT 48.0*   < > 42.0 44.3 41.7 38.4 34.6*  MCV 92.0  --   --  89.3 90.8 91.6 90.6  PLT 136*  --   --  99* 94* 94* 90*   < > = values in this interval not displayed.   Basic Metabolic Panel: Recent Labs  Lab 01/15/19 1013  01/15/19 1520 01/16/19 0347 01/16/19 0500 01/16/19 1630 01/17/19 0419 01/17/19 1700 01/18/19 0354 01/19/19 0245  NA 145   < >  --  150* 147*  --  151*  --  150* 143   K 4.6   < >  --  3.1* 3.2*  --  4.0  --  4.8 3.9  CL 108  --   --   --  113*  --  118*  --  118* 115*  CO2 18*  --   --   --  23  --  24  --  22 25  GLUCOSE 279*  --   --   --  144*  --  121*  --  88 87  BUN 33*  --   --   --  31*  --  40*  --  40* 35*  CREATININE 2.36*  --   --   --  2.16*  --  2.31*  --  2.04* 1.73*  CALCIUM 9.0  --   --   --  8.6*  --  8.6*  --  8.8* 8.3*  MG  --   --  2.3  --  2.0  --  2.1  --  2.1 2.1  PHOS  --    < > 4.5  --  3.1 2.5 2.4* 3.0 3.7 2.9   < > = values in this interval not displayed.   GFR: Estimated Creatinine Clearance: 34.6 mL/min (A) (by C-G formula based on SCr of 1.73 mg/dL (H)). Liver Function Tests: No results for input(s): AST, ALT, ALKPHOS, BILITOT, PROT, ALBUMIN in the last 168 hours. No results for input(s): LIPASE, AMYLASE in the last 168 hours. No results for input(s): AMMONIA in the last 168 hours. Coagulation Profile: No results for input(s): INR, PROTIME in the last 168 hours. Cardiac Enzymes: No results for input(s): CKTOTAL, CKMB, CKMBINDEX, TROPONINI in the last 168 hours. BNP (last 3 results) No results for input(s): PROBNP in the last 8760 hours. HbA1C: No results for input(s): HGBA1C in the last 72 hours. CBG: Recent Labs  Lab 01/18/19 0723 01/18/19 1116 01/18/19 1602 01/18/19 1950 01/19/19 0600  GLUCAP 87 111* 66* 110* 81   Lipid Profile: No results for input(s): CHOL, HDL, LDLCALC, TRIG, CHOLHDL, LDLDIRECT in the last 72 hours. Thyroid Function Tests: Recent Labs    01/17/19 0419  FREET4 1.72*   Anemia Panel: No results for input(s): VITAMINB12, FOLATE, FERRITIN, TIBC, IRON, RETICCTPCT in the last 72 hours.    Radiology Studies: I have reviewed all of the imaging during this hospital visit personally     Scheduled Meds: . amiodarone  200 mg Oral Daily  . aspirin  81 mg Oral Daily  . Chlorhexidine Gluconate Cloth  6 each Topical Daily  . clopidogrel  75 mg Oral Daily  . heparin  5,000 Units  Subcutaneous Q8H  . insulin aspart  0-15 Units Subcutaneous TID AC & HS  . levothyroxine  100 mcg Oral Q0600  . pantoprazole  40 mg Oral Daily  . potassium chloride  40 mEq Oral Once  . sodium chloride flush  10-40 mL Intracatheter Q12H  . torsemide  20 mg Oral Daily   Continuous Infusions: . ceFEPime (MAXIPIME) IV Stopped (01/19/19 0048)  . milrinone 0.25 mcg/kg/min (01/19/19 0620)     LOS: 4 days        Mauricio Gerome Apley, MD

## 2019-01-19 NOTE — Progress Notes (Signed)
Occupational Therapy Evaluation (late entry)  Pt admitted with the below listed diagnosis and deficits.  She currently requires supervision for ADLs but demonstrates decreased activity tolerance with 02 sats decreasing to 83% on RA when simulating showering in standing.  Pt lives with spouse and daughter who can provide appropriate level of assist and was mod I with ADLs and IADLs including driving.  Anticipate good progress.   No follow up OT recommended at discharge, but will follow acutely    01/18/19 1400  OT Visit Information  Last OT Received On 01/19/19  Assistance Needed +1  History of Present Illness 63 year old female with advanced CHF on milrinone infusion chronically presented 01/15/19 with hypoxia and respiratory difficulty requiring intubation. Initially felt to be volume overlaod, which may have played some role, but course proved to demonstrate HCAP.  Her PMH positive for ASD, Chronic systolic CHF, CKD stage III, DM, Dyslipidemia, Essential hypertension, Hypothyroid, ICD, Mitral regurgitation, NICM, NSVT, Obesity, OSA  and S/P mitral valve clip implantation.  Precautions  Precautions Fall  Precaution Comments new O2 dependent  Home Living  Family/patient expects to be discharged to: Private residence  Living Arrangements Spouse/significant other;Children  Available Help at Discharge Family;Available 24 hours/day  Type of Home House  Home Access Stairs to enter  Entrance Stairs-Number of Steps 3 + 5 + 5  Entrance Stairs-Rails Right;Left;Can reach both  Home Layout Two level;Able to live on main level with bedroom/bathroom  Alternate Level Stairs-Number of Steps full flight   Bathroom Shower/Tub Tub/shower unit;Curtain  Tax adviser - 4 wheels;Cane - single point;Grab bars - tub/shower  Additional Comments spouse is legally blind.  Daughter lives upstairs and works from home   Prior Function  Level of Independence Needs assistance  Gait /  Transfers Assistance Needed independent   ADL's / [place] Pt able to complete ADLs mod I.  She requires occasional assist for meal prep.  She drive short distances, and is able to grocery shop independently   Communication  Communication No difficulties  Pain Assessment  Pain Assessment No/denies pain  Cognition  Arousal/Alertness Awake/alert  Behavior During Therapy WFL for tasks assessed/performed  Overall Cognitive Status Within Functional Limits for tasks assessed  Upper Extremity Assessment  Upper Extremity Assessment Overall WFL for tasks assessed  Lower Extremity Assessment  Lower Extremity Assessment Overall WFL for tasks assessed  Cervical / Trunk Assessment  Cervical / Trunk Assessment Normal  ADL  Overall ADL's  Needs assistance/impaired  Eating/Feeding Independent  Grooming Wash/dry hands;Wash/dry face;Brushing hair;Oral care;Supervision/safety;Standing  Upper Body Bathing Supervision/ safety;Standing  Lower Body Bathing Supervison/ safety;Sit to/from stand  Lower Body Bathing Details (indicate cue type and reason) Pt simulated shower in standing with supervision and no LOB.  02 sats decreased to 83% on RA  Upper Body Dressing  Set up;Sitting  Lower Body Dressing Supervision/safety;Sit to/from Arboriculturist;Ambulation;Comfort height toilet  Toileting- Clothing Manipulation and Hygiene Supervision/safety;Sit to/from stand  Functional mobility during ADLs Supervision/safety  Bed Mobility  General bed mobility comments Pt sitting in chair   Transfers  Overall transfer level Needs assistance  Equipment used None  Transfers Sit to/from Stand;Stand Pivot Transfers  Sit to Stand Supervision  Stand pivot transfers Supervision  Balance  Overall balance assessment Mild deficits observed, not formally tested  OT - End of Session  Equipment Utilized During Treatment Oxygen  Activity Tolerance Patient limited by fatigue  Patient  left Other (comment) (with PT)  OT Assessment  OT Recommendation/Assessment Patient needs continued OT Services  OT Visit Diagnosis Muscle weakness (generalized) (M62.81)  OT Problem List Decreased activity tolerance;Cardiopulmonary status limiting activity  OT Plan  OT Frequency (ACUTE ONLY) Min 2X/week  OT Treatment/Interventions (ACUTE ONLY) Self-care/ADL training;Therapeutic exercise;Energy conservation;Therapeutic activities;Patient/family education  AM-PAC OT "6 Clicks" Daily Activity Outcome Measure (Version 2)  Help from another person eating meals? 4  Help from another person taking care of personal grooming? 4  Help from another person toileting, which includes using toliet, bedpan, or urinal? 3  Help from another person bathing (including washing, rinsing, drying)? 3  Help from another person to put on and taking off regular upper body clothing? 3  Help from another person to put on and taking off regular lower body clothing? 3  6 Click Score 20  OT Recommendation  Follow Up Recommendations No OT follow up  OT Equipment None recommended by OT  Individuals Consulted  Consulted and Agree with Results and Recommendations Patient  Acute Rehab OT Goals  Patient Stated Goal to get back to normal   OT Goal Formulation With patient  Time For Goal Achievement 02/02/19  Potential to Achieve Goals Good  OT Time Calculation  OT Start Time (ACUTE ONLY) 1139  OT Stop Time (ACUTE ONLY) 1214  OT Time Calculation (min) 35 min  OT General Charges  $OT Visit 1 Visit  OT Evaluation  $OT Eval Moderate Complexity 1 Mod  OT Treatments  $Self Care/Home Management  8-22 mins  Lucille Passy, OTR/L Acute Rehabilitation Services Pager [phone] Office [phone]

## 2019-01-20 DIAGNOSIS — Z794 Long term (current) use of insulin: Secondary | ICD-10-CM

## 2019-01-20 LAB — BASIC METABOLIC PANEL
Anion gap: 6 (ref 5–15)
BUN: 34 mg/dL — ABNORMAL HIGH (ref 8–23)
CO2: 26 mmol/L (ref 22–32)
Calcium: 8.6 mg/dL — ABNORMAL LOW (ref 8.9–10.3)
Chloride: 110 mmol/L (ref 98–111)
Creatinine, Ser: 2.05 mg/dL — ABNORMAL HIGH (ref 0.44–1.00)
GFR calc Af Amer: 29 mL/min — ABNORMAL LOW (ref >60)
GFR calc non Af Amer: 25 mL/min — ABNORMAL LOW (ref >60)
Glucose, Bld: 95 mg/dL (ref 70–99)
Potassium: 3.8 mmol/L (ref 3.5–5.1)
Sodium: 142 mmol/L (ref 135–145)

## 2019-01-20 LAB — GLUCOSE, CAPILLARY
Glucose-Capillary: 97 mg/dL (ref 70–99)
Glucose-Capillary: 87 mg/dL (ref 70–99)

## 2019-01-20 LAB — CULTURE, BLOOD (ROUTINE X 2)
Culture: NO GROWTH
Culture: NO GROWTH
Special Requests: ADEQUATE

## 2019-01-20 LAB — MAGNESIUM: Magnesium: 2.1 mg/dL (ref 1.7–2.4)

## 2019-01-20 MED ORDER — MILRINONE LACTATE IN DEXTROSE 20-5 MG/100ML-% IV SOLN: 0.2500 ug/kg/min | INTRAVENOUS | 0 refills | Status: DC

## 2019-01-20 NOTE — Discharge Summary (Signed)
Physician Discharge Summary  Veronica Burke FXO:[id number] DOB: 1955/12/28 DOA: 01/15/2019  PCP: Patient, No Pcp Per  Admit date: 01/15/2019 Discharge date: 01/20/2019  Admitted From: Home  Disposition:  Home   Recommendations for Outpatient Follow-up and new medication changes:  1. Follow up with primary care in 7 days. 2. Carvedilol has been discontinued.  3. Continue diuresis with torsemide. 4. Continue milrinone infusion.    Home Health: no   Equipment/Devices: no    Discharge Condition: stable  CODE STATUS: full  Diet recommendation: heart healthy and diabetic prudent.   Brief/Interim Summary: 63 year old female who presented with dyspnea.  She does have significant past medical history for systolic heart failure, ejection fraction 20 to 25% with chronic milrinone infusion, chronic kidney disease, type 2 diabetes mellitus, hypothyroid, dyslipidemia and obstructive sleep apnea.  She apparently developed acute onset of severe dyspnea, associated with increased work of breathing, she was found profoundly hypoxic in the emergency department and was immediately intubated and placed on  mechanical ventilation.  On her initial physical examination blood pressure 86/63, heart rate 70, temperature 96.7, respiratory rate 16, oxygen saturation 100% on 100% FiO2 and a PEEP of 5.  Her lungs were clear bilaterally, heart S1-S2 present and rhythmic, abdomen was soft nontender, positive lower extremity edema. Sodium 145, potassium 4.6, chloride 108, bicarb 18, glucose 279, BUN 33, creatinine 2.36, anion gap 19, BNP 595, white count 8.1, hemoglobin 13.8, hematocrit 48.0, platelets 136, SARS COVID-19 was negative, urinalysis 11-20 white cells, 0-5 red cells, specific gravity 1.018, protein 300.  Chest radiograph with mild left rotation, positive cardiomegaly, bilateral interstitial and alveolar infiltrates.  Positive AICD, PICC line in place.  EKG 94 bpm, right axis deviation, sinus rhythm with a right bundle  branch block, J-point elevation V1 through V5.  Significant T wave changes.  Patient was admitted to the hospital with working diagnosis of acute hypoxic respiratory failure due to acute cardiogenic pulmonary edema due to acute on chronic systolic heart failure exacerbation.  1.  Acute hypoxic respiratory failure due to acute cardiogenic pulmonary edema, acute on chronic systolic heart failure decompensation.  Left ventricle ejection fraction 20 to 25%.  Patient was admitted to the intensive care unit, she was aggressively diuresed with IV furosemide, she required invasive mechanical ventilation.  She responded well to the medical therapy, negative fluid balance was achieved (-4,394 ml) and she was successfully liberated from mechanical ventilation on July 15.  By the time of her discharge her oximetry is 99% on room air.  Patient will continue heart failure management with continuous infusion of milrinone, afterload reduction with hydralazine and isosorbide.  Heart failure team has recommended to hold on carvedilol for now.  Patient will follow-up with his primary cardiologist as an outpatient.   2.  Multilobar community-acquired pneumonia, present on admission.  Her chest radiograph suggestive for pulmonary infection, she received 5 days of IV antibiotic therapy, patient has remained afebrile, no leukocytosis and her cultures have been no growth.    3.  Acute kidney injury chronic kidney disease stage IIIa with hypokalemia.  Patient received aggressive diuresis with IV furosemide. She had a close monitoring of her kidney function and electrolytes, potassium was corrected, her discharge serum creatinine is 2.0, potassium 3.9, serum bicarb 26.  Patient will continue oral torsemide 20 mg daily.  4.  Type 2 diabetes mellitus.  Patient was placed on insulin sliding scale for glucose coverage and monitoring, her capillary glucose remained well controlled. At home to resume insulin levimir, liraglutide  and  sitagliptin.   5.  Obesity with dyslipidemia.  Calculated BMI 29.8. Continue atorvastatin therapy.   6.  Hypothyroid.  Continue levothyroxine.  7. Moderate to severe mitral regurgitation sp mitral clip 01/02/19. Continue amiodarone, dual antiplatelet therapy with aspirin and clopidogrel.   Discharge Diagnoses:  Principal Problem:   Acute on chronic respiratory failure with hypoxia (HCC) Active Problems:   Acute on chronic HFrEF (heart failure with reduced ejection fraction) (HCC)   Type 2 diabetes mellitus with stage 3 chronic kidney disease ([place])   CKD stage 3 due to type 2 diabetes mellitus ([place])   Hypothyroid   Obesity    Discharge Instructions   Allergies as of 01/20/2019   No Known Allergies     Medication List    STOP taking these medications   carvedilol 6.25 MG tablet Commonly known as: COREG   oxyCODONE-acetaminophen 5-325 MG tablet Commonly known as: PERCOCET/ROXICET     TAKE these medications   amiodarone 200 MG tablet Commonly known as: PACERONE Take 200 mg by mouth daily.   aspirin EC 81 MG tablet Take 81 mg by mouth daily.   atorvastatin 40 MG tablet Commonly known as: LIPITOR Take 40 mg by mouth daily.   clopidogrel 75 MG tablet Commonly known as: PLAVIX Take 75 mg by mouth daily.   hydrALAZINE 25 MG tablet Commonly known as: APRESOLINE Take 25 mg by mouth 3 (three) times daily.   iron polysaccharides 150 MG capsule Commonly known as: NIFEREX Take 150 mg by mouth every Monday, Wednesday, and Friday.   isosorbide dinitrate 20 MG tablet Commonly known as: ISORDIL Take 20 mg by mouth 3 (three) times daily.   Levemir FlexTouch 100 UNIT/ML Pen Generic drug: Insulin Detemir Inject 15 Units into the skin at bedtime.   levothyroxine 100 MCG tablet Commonly known as: SYNTHROID Take 100 mcg by mouth daily before breakfast.   liraglutide 18 MG/3ML Sopn Commonly known as: VICTOZA Inject 1.8 mg into the skin daily.   magnesium oxide 400 MG  tablet Commonly known as: MAG-OX Take 400 mg by mouth 2 (two) times daily.   milrinone 20 MG/100 ML Soln infusion Commonly known as: PRIMACOR Inject 0.0207 mg/min into the vein continuous.   sitaGLIPtin 50 MG tablet Commonly known as: JANUVIA Take 50 mg by mouth daily.   torsemide 20 MG tablet Commonly known as: DEMADEX Take 20 mg by mouth daily.       No Known Allergies  Consultations:  Cardiology    Procedures/Studies: Dg Chest Port 1 View  Result Date: 01/17/2019 CLINICAL DATA:  Hypoxia EXAM: PORTABLE CHEST 1 VIEW COMPARISON:  Yesterday FINDINGS: Endotracheal tube tip between the clavicular heads and carina. The orogastric tube at least reaches the stomach. Single chamber ICD/pacer into the right ventricle is stable. Right upper extremity PICC with tip at the SVC. Bilateral airspace disease with mild improvement. No effusion or pneumothorax. Cardiomegaly. IMPRESSION: 1. Stable hardware positioning. 2. Improved but still extensive airspace disease. Electronically Signed   By: Monte Fantasia M.D.   On: 01/17/2019 07:23   Dg Chest Port 1 View  Result Date: 01/16/2019 CLINICAL DATA:  Endotracheal tube placement.  Respiratory failure. EXAM: PORTABLE CHEST 1 VIEW COMPARISON:  Radiograph of January 15, 2019. FINDINGS: Stable cardiomediastinal silhouette. Endotracheal and nasogastric tubes are unchanged in position. Stable left-sided pacemaker. Stable right-sided PICC line. No pneumothorax is noted. Stable bilateral lung opacities concerning for pneumonia. Bony thorax is unremarkable. IMPRESSION: Stable support apparatus.  Stable bilateral lung opacities. Electronically Signed  By: Marijo Conception M.D.   On: 01/16/2019 07:29   Dg Chest Port 1 View  Result Date: 01/15/2019 CLINICAL DATA:  Endotracheal and orogastric tube placement. EXAM: PORTABLE CHEST 1 VIEW COMPARISON:  Chest CT June 29, 2018 FINDINGS: Endotracheal tube terminates 4 cm above the carina. Enteric catheter  transverses the thorax and descends into the expected location of gastric body, tip collimated off the image. Single lead cardiac pacemaker in place. The cardiac silhouette is enlarged. Mediastinal contours appear intact. Bilateral patchy alveolar and interstitial opacities with central predominance. No evidence of pneumothorax. Osseous structures are without acute abnormality. Soft tissues are grossly normal. IMPRESSION: 1. Support apparatus as described. 2. Bilateral patchy alveolar and interstitial opacities with central predominance. This may represent mixed pattern pulmonary edema or bilateral airspace consolidation. 3. Enlarged cardiac silhouette. Electronically Signed   By: Fidela Salisbury M.D.   On: 01/15/2019 10:40      Procedures:   Subjective: Patient is feeling back to her baseline, no nausea or vomiting, no chest pain or dyspnea.   Discharge Exam: Vitals:   01/20/19 0344 01/20/19 0746  BP: 113/61 118/73  Pulse:  (!) 59  Resp: 19 (!) 21  Temp: 98.1 F (36.7 C) 97.9 F (36.6 C)  SpO2: 100% 100%   Vitals:   01/19/19 2356 01/20/19 0112 01/20/19 0344 01/20/19 0746  BP: 118/65  113/61 118/73  Pulse: 63   (!) 59  Resp: 18  19 (!) 21  Temp: 98.1 F (36.7 C)  98.1 F (36.7 C) 97.9 F (36.6 C)  TempSrc: Oral  Oral Oral  SpO2: 99% 97% 100% 100%  Weight:      Height:        General: Not in pain or dyspnea  Neurology: Awake and alert, non focal  E ENT: no pallor, no icterus, oral mucosa moist Cardiovascular: No JVD. S1-S2 present, rhythmic, no gallops, rubs, or murmurs. No lower extremity edema. Pulmonary: positive breath sounds bilaterally, adequate air movement, no wheezing, rhonchi or rales. Gastrointestinal. Abdomen with no organomegaly, non tender, no rebound or guarding Skin. No rashes Musculoskeletal: no joint deformities   The results of significant diagnostics from this hospitalization (including imaging, microbiology, ancillary and laboratory) are listed  below for reference.     Microbiology: Recent Results (from the past 240 hour(s))  SARS Coronavirus 2 (CEPHEID - Performed in [place] hospital lab), Hosp Order     Status: None   Collection Time: 01/15/19 10:06 AM   Specimen: Nasopharyngeal Swab  Result Value Ref Range Status   SARS Coronavirus 2 NEGATIVE NEGATIVE Final    Comment: (NOTE) If result is NEGATIVE SARS-CoV-2 target nucleic acids are NOT DETECTED. The SARS-CoV-2 RNA is generally detectable in upper and lower  respiratory specimens during the acute phase of infection. The lowest  concentration of SARS-CoV-2 viral copies this assay can detect is 250  copies / mL. A negative result does not preclude SARS-CoV-2 infection  and should not be used as the sole basis for treatment or other  patient management decisions.  A negative result may occur with  improper specimen collection / handling, submission of specimen other  than nasopharyngeal swab, presence of viral mutation(s) within the  areas targeted by this assay, and inadequate number of viral copies  (<250 copies / mL). A negative result must be combined with clinical  observations, patient history, and epidemiological information. If result is POSITIVE SARS-CoV-2 target nucleic acids are DETECTED. The SARS-CoV-2 RNA is generally detectable in upper and  lower  respiratory specimens dur ing the acute phase of infection.  Positive  results are indicative of active infection with SARS-CoV-2.  Clinical  correlation with patient history and other diagnostic information is  necessary to determine patient infection status.  Positive results do  not rule out bacterial infection or co-infection with other viruses. If result is PRESUMPTIVE POSTIVE SARS-CoV-2 nucleic acids MAY BE PRESENT.   A presumptive positive result was obtained on the submitted specimen  and confirmed on repeat testing.  While 2019 novel coronavirus  (SARS-CoV-2) nucleic acids may be present in the  submitted sample  additional confirmatory testing may be necessary for epidemiological  and / or clinical management purposes  to differentiate between  SARS-CoV-2 and other Sarbecovirus currently known to infect humans.  If clinically indicated additional testing with an alternate test  methodology [phone]) is advised. The SARS-CoV-2 RNA is generally  detectable in upper and lower respiratory sp ecimens during the acute  phase of infection. The expected result is Negative. Fact Sheet for Patients:  StrictlyIdeas.no Fact Sheet for Healthcare Providers: BankingDealers.co.za This test is not yet approved or cleared by the [place] FDA and has been authorized for detection and/or diagnosis of SARS-CoV-2 by FDA under an Emergency Use Authorization (EUA).  This EUA will remain in effect (meaning this test can be used) for the duration of the COVID-19 declaration under Section 564(b)(1) of the Act, 21 U.S.C. section 360bbb-3(b)(1), unless the authorization is terminated or revoked sooner. Performed at [facility], [place] [address], [place], [place] [address]   Culture, blood (routine x 2)     Status: None (Preliminary result)   Collection Time: 01/15/19 10:08 AM   Specimen: BLOOD LEFT WRIST  Result Value Ref Range Status   Specimen Description BLOOD LEFT WRIST  Final   Special Requests   Final    BOTTLES DRAWN AEROBIC AND ANAEROBIC Blood Culture adequate volume   Culture   Final    NO GROWTH 4 DAYS Performed at [facility], [place] [address], [place], [place] [address]    Report Status PENDING  Incomplete  Culture, blood (routine x 2)     Status: None (Preliminary result)   Collection Time: 01/15/19 10:23 AM   Specimen: BLOOD  Result Value Ref Range Status   Specimen Description BLOOD LEFT ANTECUBITAL  Final   Special Requests   Final    AEROBIC BOTTLE ONLY Blood Culture results may not be optimal due to an inadequate  volume of blood received in culture bottles   Culture   Final    NO GROWTH 4 DAYS Performed at [facility], [place] [address], [place], [place] [address]    Report Status PENDING  Incomplete  MRSA PCR Screening     Status: None   Collection Time: 01/15/19  3:13 PM   Specimen: Nasopharyngeal  Result Value Ref Range Status   MRSA by PCR NEGATIVE NEGATIVE Final    Comment:        The GeneXpert MRSA Assay (FDA approved for NASAL specimens only), is one component of a comprehensive MRSA colonization surveillance program. It is not intended to diagnose MRSA infection nor to guide or monitor treatment for MRSA infections. Performed at [facility], [place] [address], [place], [place] [address]   Culture, respiratory (non-expectorated)     Status: None   Collection Time: 01/15/19  8:23 PM   Specimen: Tracheal Aspirate; Respiratory  Result Value Ref Range Status   Specimen  Description TRACHEAL ASPIRATE  Final   Special Requests NONE  Final   Gram Stain   Final    ABUNDANT WBC PRESENT,BOTH PMN AND MONONUCLEAR RARE GRAM POSITIVE COCCI    Culture   Final    FEW Consistent with normal respiratory flora. Performed at [facility], [place] [address], [place], [place] [address]    Report Status 01/18/2019 FINAL  Final  Culture, Urine     Status: None   Collection Time: 01/16/19  5:37 AM   Specimen: Urine, Random  Result Value Ref Range Status   Specimen Description URINE, RANDOM  Final   Special Requests NONE  Final   Culture   Final    NO GROWTH Performed at [facility], [place] [address], [place], [place] [address]    Report Status 01/17/2019 FINAL  Final     Labs: BNP (last 3 results) Recent Labs    01/15/19 1013  BNP 332.9*   Basic Metabolic Panel: Recent Labs  Lab 01/16/19 0500 01/16/19 1630 01/17/19 0419 01/17/19 1700 01/18/19 0354 01/19/19 0245 01/20/19 0400  NA 147*  --  151*  --  150* 143 142  K 3.2*  --  4.0  --  4.8 3.9 3.8  CL 113*  --   118*  --  118* 115* 110  CO2 23  --  24  --  22 25 26   GLUCOSE 144*  --  121*  --  88 87 95  BUN 31*  --  40*  --  40* 35* 34*  CREATININE 2.16*  --  2.31*  --  2.04* 1.73* 2.05*  CALCIUM 8.6*  --  8.6*  --  8.8* 8.3* 8.6*  MG 2.0  --  2.1  --  2.1 2.1 2.1  PHOS 3.1 2.5 2.4* 3.0 3.7 2.9  --    Liver Function Tests: No results for input(s): AST, ALT, ALKPHOS, BILITOT, PROT, ALBUMIN in the last 168 hours. No results for input(s): LIPASE, AMYLASE in the last 168 hours. No results for input(s): AMMONIA in the last 168 hours. CBC: Recent Labs  Lab 01/15/19 1013  01/16/19 0347 01/16/19 0500 01/17/19 0419 01/18/19 0542 01/19/19 0245  WBC 8.1  --   --  15.7* 9.7 8.6 6.3  NEUTROABS 4.5  --   --   --   --   --   --   HGB 13.8   < > 14.3 13.2 12.3 11.0* 10.1*  HCT 48.0*   < > 42.0 44.3 41.7 38.4 34.6*  MCV 92.0  --   --  89.3 90.8 91.6 90.6  PLT 136*  --   --  99* 94* 94* 90*   < > = values in this interval not displayed.   Cardiac Enzymes: No results for input(s): CKTOTAL, CKMB, CKMBINDEX, TROPONINI in the last 168 hours. BNP: Invalid input(s): POCBNP CBG: Recent Labs  Lab 01/19/19 0600 01/19/19 1152 01/19/19 1619 01/19/19 2126 01/20/19 0610  GLUCAP 81 90 98 105* 97   D-Dimer No results for input(s): DDIMER in the last 72 hours. Hgb A1c No results for input(s): HGBA1C in the last 72 hours. Lipid Profile No results for input(s): CHOL, HDL, LDLCALC, TRIG, CHOLHDL, LDLDIRECT in the last 72 hours. Thyroid function studies No results for input(s): TSH, T4TOTAL, T3FREE, THYROIDAB in the last 72 hours.  Invalid input(s): FREET3 Anemia work up No results for input(s): VITAMINB12, FOLATE, FERRITIN, TIBC, IRON, RETICCTPCT in the last 72 hours. Urinalysis    Component Value  Date/Time   COLORURINE YELLOW 01/15/2019 1045   APPEARANCEUR CLOUDY (A) 01/15/2019 1045   LABSPEC 1.018 01/15/2019 1045   PHURINE 6.0 01/15/2019 1045   GLUCOSEU 150 (A) 01/15/2019 1045   HGBUR NEGATIVE  01/15/2019 [place] 01/15/2019 [place] 01/15/2019 1045   PROTEINUR >=300 (A) 01/15/2019 1045   NITRITE NEGATIVE 01/15/2019 1045   LEUKOCYTESUR NEGATIVE 01/15/2019 1045   Sepsis Labs Invalid input(s): PROCALCITONIN,  WBC,  LACTICIDVEN Microbiology Recent Results (from the past 240 hour(s))  SARS Coronavirus 2 (CEPHEID - Performed in [place] hospital lab), Hosp Order     Status: None   Collection Time: 01/15/19 10:06 AM   Specimen: Nasopharyngeal Swab  Result Value Ref Range Status   SARS Coronavirus 2 NEGATIVE NEGATIVE Final    Comment: (NOTE) If result is NEGATIVE SARS-CoV-2 target nucleic acids are NOT DETECTED. The SARS-CoV-2 RNA is generally detectable in upper and lower  respiratory specimens during the acute phase of infection. The lowest  concentration of SARS-CoV-2 viral copies this assay can detect is 250  copies / mL. A negative result does not preclude SARS-CoV-2 infection  and should not be used as the sole basis for treatment or other  patient management decisions.  A negative result may occur with  improper specimen collection / handling, submission of specimen other  than nasopharyngeal swab, presence of viral mutation(s) within the  areas targeted by this assay, and inadequate number of viral copies  (<250 copies / mL). A negative result must be combined with clinical  observations, patient history, and epidemiological information. If result is POSITIVE SARS-CoV-2 target nucleic acids are DETECTED. The SARS-CoV-2 RNA is generally detectable in upper and lower  respiratory specimens dur ing the acute phase of infection.  Positive  results are indicative of active infection with SARS-CoV-2.  Clinical  correlation with patient history and other diagnostic information is  necessary to determine patient infection status.  Positive results do  not rule out bacterial infection or co-infection with other viruses. If result is  PRESUMPTIVE POSTIVE SARS-CoV-2 nucleic acids MAY BE PRESENT.   A presumptive positive result was obtained on the submitted specimen  and confirmed on repeat testing.  While 2019 novel coronavirus  (SARS-CoV-2) nucleic acids may be present in the submitted sample  additional confirmatory testing may be necessary for epidemiological  and / or clinical management purposes  to differentiate between  SARS-CoV-2 and other Sarbecovirus currently known to infect humans.  If clinically indicated additional testing with an alternate test  methodology [phone]) is advised. The SARS-CoV-2 RNA is generally  detectable in upper and lower respiratory sp ecimens during the acute  phase of infection. The expected result is Negative. Fact Sheet for Patients:  StrictlyIdeas.no Fact Sheet for Healthcare Providers: BankingDealers.co.za This test is not yet approved or cleared by the [place] FDA and has been authorized for detection and/or diagnosis of SARS-CoV-2 by FDA under an Emergency Use Authorization (EUA).  This EUA will remain in effect (meaning this test can be used) for the duration of the COVID-19 declaration under Section 564(b)(1) of the Act, 21 U.S.C. section 360bbb-3(b)(1), unless the authorization is terminated or revoked sooner. Performed at [facility], [place] [address] [place], [place] [address]   Culture, blood (routine x 2)     Status: None (Preliminary result)   Collection Time: 01/15/19 10:08 AM   Specimen: BLOOD LEFT WRIST  Result Value Ref Range Status   Specimen Description BLOOD LEFT  WRIST  Final   Special Requests   Final    BOTTLES DRAWN AEROBIC AND ANAEROBIC Blood Culture adequate volume   Culture   Final    NO GROWTH 4 DAYS Performed at [facility], [address], [place], [place] [address]    Report Status PENDING  Incomplete  Culture, blood (routine x 2)     Status: None (Preliminary result)    Collection Time: 01/15/19 10:23 AM   Specimen: BLOOD  Result Value Ref Range Status   Specimen Description BLOOD LEFT ANTECUBITAL  Final   Special Requests   Final    AEROBIC BOTTLE ONLY Blood Culture results may not be optimal due to an inadequate volume of blood received in culture bottles   Culture   Final    NO GROWTH 4 DAYS Performed at [facility], [place] [address] [place], [place] [address]    Report Status PENDING  Incomplete  MRSA PCR Screening     Status: None   Collection Time: 01/15/19  3:13 PM   Specimen: Nasopharyngeal  Result Value Ref Range Status   MRSA by PCR NEGATIVE NEGATIVE Final    Comment:        The GeneXpert MRSA Assay (FDA approved for NASAL specimens only), is one component of a comprehensive MRSA colonization surveillance program. It is not intended to diagnose MRSA infection nor to guide or monitor treatment for MRSA infections. Performed at [facility], [place] [address] [place], [place] [address]   Culture, respiratory (non-expectorated)     Status: None   Collection Time: 01/15/19  8:23 PM   Specimen: Tracheal Aspirate; Respiratory  Result Value Ref Range Status   Specimen Description TRACHEAL ASPIRATE  Final   Special Requests NONE  Final   Gram Stain   Final    ABUNDANT WBC PRESENT,BOTH PMN AND MONONUCLEAR RARE GRAM POSITIVE COCCI    Culture   Final    FEW Consistent with normal respiratory flora. Performed at [facility], [place] [address] [place], [place] [address]    Report Status 01/18/2019 FINAL  Final  Culture, Urine     Status: None   Collection Time: 01/16/19  5:37 AM   Specimen: Urine, Random  Result Value Ref Range Status   Specimen Description URINE, RANDOM  Final   Special Requests NONE  Final   Culture   Final    NO GROWTH Performed at [facility], [place] [address] [place], [place] [address]    Report Status 01/17/2019 FINAL  Final     Time coordinating discharge: 45  minutes  SIGNED:   Tawni Millers, MD  [facility] 01/20/2019, 10:17 AM

## 2019-01-20 NOTE — Progress Notes (Signed)
Pt left unit at 1149hrs in wheelchair.

## 2021-01-17 IMAGING — DX PORTABLE CHEST - 1 VIEW
1 series · 1 of 1 positions shown · non-contrast
Comparison: Chest CT June 29, 2018

CLINICAL DATA: Endotracheal and orogastric tube placement.

EXAM:
PORTABLE CHEST 1 VIEW

[chest ap]
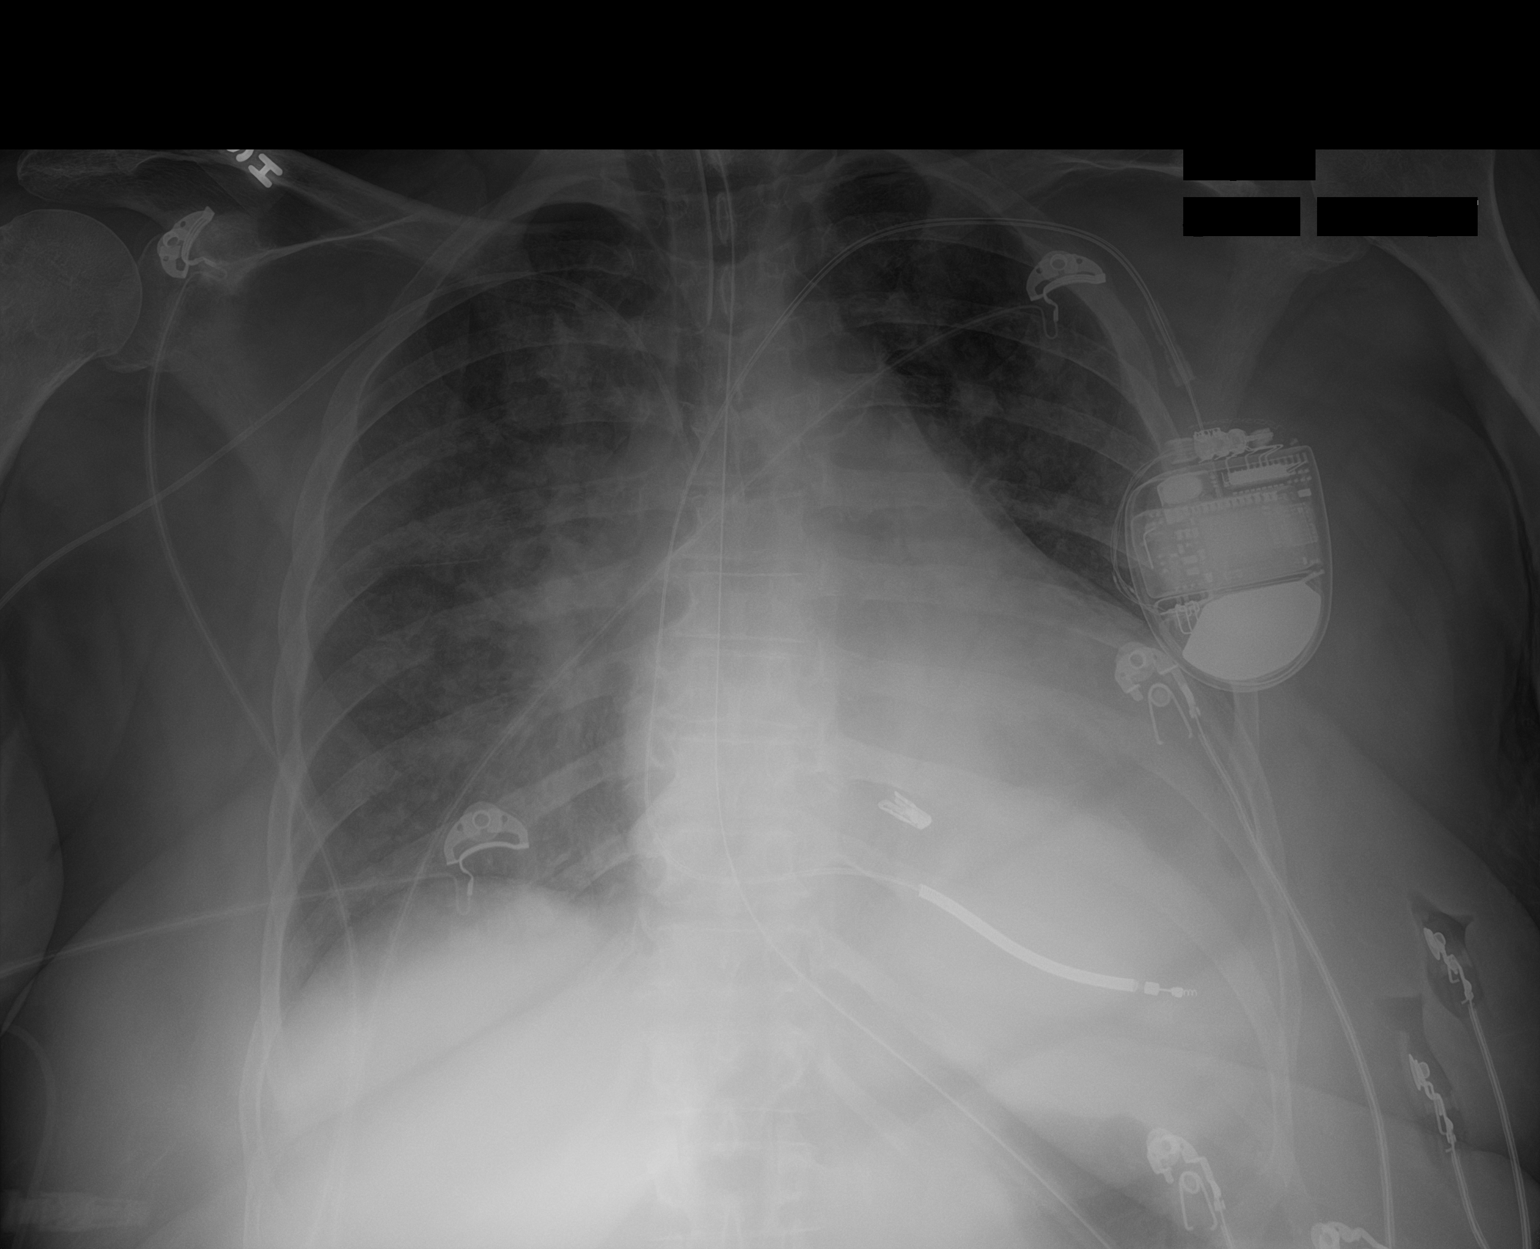

[1 of 1 positions shown; findings below may reference images not displayed]

FINDINGS: Endotracheal tube terminates 4 cm above the carina. Enteric catheter
transverses the thorax and descends into the expected location of
gastric body, tip collimated off the image. Single lead cardiac
pacemaker in place.

The cardiac silhouette is enlarged. Mediastinal contours appear
intact.

Bilateral patchy alveolar and interstitial opacities with central
predominance. No evidence of pneumothorax.

Osseous structures are without acute abnormality. Soft tissues are
grossly normal.
IMPRESSION: 1. Support apparatus as described.
2. Bilateral patchy alveolar and interstitial opacities with central
predominance. This may represent mixed pattern pulmonary edema or
bilateral airspace consolidation.
3. Enlarged cardiac silhouette.

## 2021-09-20 ENCOUNTER — Inpatient Hospital Stay
Admission: AD | Admit: 2021-09-20 | Payer: BLUE CROSS/BLUE SHIELD | Source: Other Acute Inpatient Hospital | Admitting: Pulmonary Disease

## 2023-10-26 ENCOUNTER — Emergency Department (HOSPITAL_COMMUNITY)
Admission: EM | Admit: 2023-10-26 | Discharge: 2023-10-26 | Disposition: A | Discharge status: Home / Self Care | Attending: Emergency Medicine | Admitting: Emergency Medicine

## 2023-10-26 ENCOUNTER — Encounter (HOSPITAL_COMMUNITY): Payer: Self-pay

## 2023-10-26 ENCOUNTER — Other Ambulatory Visit: Payer: Self-pay

## 2023-10-26 ENCOUNTER — Emergency Department (HOSPITAL_COMMUNITY)

## 2023-10-26 DIAGNOSIS — N184 Chronic kidney disease, stage 4 (severe): Secondary | ICD-10-CM | POA: Insufficient documentation

## 2023-10-26 DIAGNOSIS — I5043 Acute on chronic combined systolic (congestive) and diastolic (congestive) heart failure: Secondary | ICD-10-CM | POA: Insufficient documentation

## 2023-10-26 DIAGNOSIS — R0602 Shortness of breath: Secondary | ICD-10-CM | POA: Diagnosis present

## 2023-10-26 DIAGNOSIS — Z7902 Long term (current) use of antithrombotics/antiplatelets: Secondary | ICD-10-CM | POA: Insufficient documentation

## 2023-10-26 DIAGNOSIS — E1122 Type 2 diabetes mellitus with diabetic chronic kidney disease: Secondary | ICD-10-CM | POA: Diagnosis not present

## 2023-10-26 DIAGNOSIS — Z794 Long term (current) use of insulin: Secondary | ICD-10-CM | POA: Diagnosis not present

## 2023-10-26 DIAGNOSIS — Z7982 Long term (current) use of aspirin: Secondary | ICD-10-CM | POA: Diagnosis not present

## 2023-10-26 LAB — CBC
HCT: 48.6 % — ABNORMAL HIGH (ref 36.0–46.0)
Hemoglobin: 14.8 g/dL (ref 12.0–15.0)
MCH: 28.1 pg (ref 26.0–34.0)
MCHC: 30.5 g/dL (ref 30.0–36.0)
MCV: 92.4 fL (ref 80.0–100.0)
Platelets: 167 10*3/uL (ref 150–400)
RBC: 5.26 MIL/uL — ABNORMAL HIGH (ref 3.87–5.11)
RDW: 15.8 % — ABNORMAL HIGH (ref 11.5–15.5)
WBC: 6.6 10*3/uL (ref 4.0–10.5)
nRBC: 0 % (ref 0.0–0.2)

## 2023-10-26 LAB — BASIC METABOLIC PANEL WITH GFR
Anion gap: 14 (ref 5–15)
BUN: 29 mg/dL — ABNORMAL HIGH (ref 8–23)
CO2: 18 mmol/L — ABNORMAL LOW (ref 22–32)
Calcium: 8.8 mg/dL — ABNORMAL LOW (ref 8.9–10.3)
Chloride: 111 mmol/L (ref 98–111)
Creatinine, Ser: 2.02 mg/dL — ABNORMAL HIGH (ref 0.44–1.00)
GFR, Estimated: 26 mL/min — ABNORMAL LOW (ref >60)
Glucose, Bld: 158 mg/dL — ABNORMAL HIGH (ref 70–99)
Potassium: 3.8 mmol/L (ref 3.5–5.1)
Sodium: 143 mmol/L (ref 135–145)

## 2023-10-26 LAB — BRAIN NATRIURETIC PEPTIDE: B Natriuretic Peptide: 1274.7 pg/mL — ABNORMAL HIGH (ref 0.0–100.0)

## 2023-10-26 MED ORDER — POTASSIUM CHLORIDE CRYS ER 20 MEQ PO TBCR
20.0000 meq | EXTENDED_RELEASE_TABLET | Freq: Once | ORAL | Status: AC
  Administered 2023-10-26: 20 meq via ORAL
  Filled 2023-10-26: qty 1

## 2023-10-26 MED ORDER — POTASSIUM CHLORIDE CRYS ER 20 MEQ PO TBCR
40.0000 meq | EXTENDED_RELEASE_TABLET | Freq: Once | ORAL | Status: AC
  Administered 2023-10-26: 40 meq via ORAL
  Filled 2023-10-26: qty 2

## 2023-10-26 MED ORDER — ALBUTEROL SULFATE HFA 108 (90 BASE) MCG/ACT IN AERS: 2.0000 | INHALATION_SPRAY | RESPIRATORY_TRACT | Status: DC | PRN

## 2023-10-26 MED ORDER — FUROSEMIDE 10 MG/ML IJ SOLN
40.0000 mg | Freq: Once | INTRAMUSCULAR | Status: AC
  Administered 2023-10-26: 40 mg via INTRAVENOUS
  Filled 2023-10-26: qty 4

## 2023-10-26 MED ORDER — FUROSEMIDE 10 MG/ML IJ SOLN
20.0000 mg | Freq: Once | INTRAMUSCULAR | Status: AC
  Administered 2023-10-26: 20 mg via INTRAVENOUS
  Filled 2023-10-26: qty 2

## 2023-10-26 NOTE — ED Triage Notes (Signed)
 Pt here for [facility] that started this morning. Denies chest pain. Hx of CHF. Denies swelling.

## 2023-10-26 NOTE — Discharge Instructions (Addendum)
 Your BNP was elevated at 1,274.7 - this can be associated with your CHF. Call your cardiologist tomorrow and schedule a follow up appointment for reevaluation.  Get help right away if: You have trouble breathing when resting. You have trouble staying awake or you feel confused. You have chest pain. You faint. You have fast or irregular heartbeats.

## 2023-10-26 NOTE — ED Provider Notes (Signed)
 [place] EMERGENCY DEPARTMENT AT [facility] Provider Note   CSN: [id number] Arrival date & time: 10/26/23  7829     History  Chief Complaint  Patient presents with   Shortness of Breath    Veronica Burke is a 68 y.o. female with a history of CHF, type 2 diabetes mellitus, stage 4 CKD, and ICD implant presents to the ED today for shortness of breath.  Patient reports that she woke up this morning coughing and felt short of breath.  States that this has persisted since.  Reports increased shortness of breath with ambulation.  Denies associated chest pain or fevers.  Cough has improved since she woke up.  No recent hospitalizations, surgeries, or long travels.  Denies leg pain or swelling.  Denies missing any recent Eliquis doses.  No additional complaints or concerns at this time.    Home Medications Prior to Admission medications   Medication Sig Start Date End Date Taking? Authorizing Provider  amiodarone  (PACERONE ) 200 MG tablet Take 200 mg by mouth daily. 12/18/18   [provider]  aspirin  EC 81 MG tablet Take 81 mg by mouth daily.    [provider]  atorvastatin (LIPITOR) 40 MG tablet Take 40 mg by mouth daily. 12/13/18   [provider]  clopidogrel  (PLAVIX ) 75 MG tablet Take 75 mg by mouth daily. 12/07/18   [provider]  hydrALAZINE  (APRESOLINE ) 25 MG tablet Take 25 mg by mouth 3 (three) times daily.  01/04/19   [provider]  Insulin  Detemir (LEVEMIR FLEXTOUCH) 100 UNIT/ML Pen Inject 15 Units into the skin at bedtime.    [provider]  iron polysaccharides (NIFEREX) 150 MG capsule Take 150 mg by mouth every Monday, Wednesday, and Friday.    [provider]  isosorbide dinitrate (ISORDIL) 20 MG tablet Take 20 mg by mouth 3 (three) times daily. 12/22/18   [provider]  levothyroxine  (SYNTHROID ) 100 MCG tablet Take 100 mcg by mouth daily before breakfast. 12/22/18   [provider]   liraglutide (VICTOZA) 18 MG/3ML SOPN Inject 1.8 mg into the skin daily.    [provider]  magnesium oxide (MAG-OX) 400 MG tablet Take 400 mg by mouth 2 (two) times daily.  12/08/18   [provider]  milrinone  (PRIMACOR ) 20 MG/100 ML SOLN infusion Inject 0.0207 mg/min into the vein continuous. 01/20/19 02/19/19  Arrien, Mauricio Daniel, MD  sitaGLIPtin (JANUVIA) 50 MG tablet Take 50 mg by mouth daily.    [provider]  torsemide  (DEMADEX ) 20 MG tablet Take 20 mg by mouth daily. 12/18/18   [provider]      Allergies    Patient has no known allergies.    Review of Systems   Review of Systems  Respiratory:  Positive for shortness of breath.   All other systems reviewed and are negative.   Physical Exam Updated Vital Signs BP 135/78   Pulse (!) 59   Temp 98 F (36.7 C)   Resp 17   Ht 5\' 1"  (1.549 m)   Wt 82.1 kg   SpO2 96%   BMI 34.20 kg/m  Physical Exam Vitals and nursing note reviewed.  Constitutional:      General: She is not in acute distress.    Appearance: Normal appearance.  HENT:     Head: Normocephalic and atraumatic.     Mouth/Throat:     Mouth: Mucous membranes are moist.  Eyes:     Conjunctiva/sclera: Conjunctivae normal.  Pupils: Pupils are equal, round, and reactive to light.  Cardiovascular:     Rate and Rhythm: Normal rate and regular rhythm.     Pulses: Normal pulses.     Heart sounds: Normal heart sounds.  Pulmonary:     Effort: Pulmonary effort is normal.     Breath sounds: Normal breath sounds.     Comments: Patient is able to speak in full sentences. Abdominal:     General: There is no distension.     Palpations: Abdomen is soft.     Tenderness: There is no abdominal tenderness.  Musculoskeletal:        General: Normal range of motion.     Cervical back: Normal range of motion.     Right lower leg: No edema.     Left lower leg: No edema.     Comments: No tenderness to palpation of the lower  extremities or swelling present.  Skin:    General: Skin is warm and dry.     Findings: No rash.  Neurological:     General: No focal deficit present.     Mental Status: She is alert.  Psychiatric:        Mood and Affect: Mood normal.        Behavior: Behavior normal.    ED Results / Procedures / Treatments   Labs (all labs ordered are listed, but only abnormal results are displayed) Labs Reviewed  BASIC METABOLIC PANEL WITH GFR - Abnormal; Notable for the following components:      Result Value   CO2 18 (*)    Glucose, Bld 158 (*)    BUN 29 (*)    Creatinine, Ser 2.02 (*)    Calcium 8.8 (*)    GFR, Estimated 26 (*)    All other components within normal limits  CBC - Abnormal; Notable for the following components:   RBC 5.26 (*)    HCT 48.6 (*)    RDW 15.8 (*)    All other components within normal limits  BRAIN NATRIURETIC PEPTIDE - Abnormal; Notable for the following components:   B Natriuretic Peptide 1,274.7 (*)    All other components within normal limits    EKG EKG Interpretation Date/Time:  Wednesday October 26 2023 08:07:11 EDT Ventricular Rate:  61 PR Interval:    QRS Duration:  162 QT Interval:  524 QTC Calculation: 527 R Axis:   235  Text Interpretation: Ventricular-paced rhythm Abnormal ECG When compared with ECG of 15-Jan-2019 10:03, PREVIOUS ECG IS PRESENT Confirmed by Annita Kindle [phone]) on 10/26/2023 10:56:12 AM  Radiology DG Chest 2 View Result Date: 10/26/2023 CLINICAL DATA:  Shortness of breath. EXAM: CHEST - 2 VIEW COMPARISON:  September 20, 2021. FINDINGS: Stable cardiomegaly. Left-sided defibrillator is unchanged. Stimulator device is seen over right chest. Visualized lungs are clear. Bony thorax is unremarkable. IMPRESSION: No active cardiopulmonary disease. Electronically Signed   By: Rosalene Colon M.D.   On: 10/26/2023 08:30    Procedures Procedures    Medications Ordered in ED Medications  potassium chloride  SA (KLOR-CON  M) CR tablet 40  mEq (40 mEq Oral Given 10/26/23 1211)  furosemide  (LASIX ) injection 40 mg (40 mg Intravenous Given 10/26/23 1212)  furosemide  (LASIX ) injection 20 mg (20 mg Intravenous Given 10/26/23 1424)  potassium chloride  SA (KLOR-CON  M) CR tablet 20 mEq (20 mEq Oral Given 10/26/23 1424)    ED Course/ Medical Decision Making/ A&P  Medical Decision Making Amount and/or Complexity of Data Reviewed Labs: ordered. Radiology: ordered.  Risk Prescription drug management.   This patient presents to the ED for concern of shortness of breath, this involves an extensive number of treatment options, and is a complaint that carries with it a high risk of complications and morbidity.   Differential diagnosis includes: Viral illness, pneumonia, bronchitis, CHF exacerbation, pulmonary embolism, etc. Low suspicion for PE - patient is on Eliquis and denies missed doses. No recent travels, hospitalizations, or surgeries. No lower extremity pain/swelling.    Comorbidities  See HPI above   Additional History  Additional history obtained from prior records   Cardiac Monitoring / EKG  The patient was maintained on a cardiac monitor.  I personally viewed and interpreted the cardiac monitored which showed: ventricular paced rhythm with a heart rate of 71 bpm.   Lab Tests  I ordered and personally interpreted labs.  The pertinent results include:   BNP of 1,274.7 BMP and CBC are within normal limits for patient   Imaging Studies  I ordered imaging studies including CXR  I independently visualized and interpreted imaging which showed:  No acute cardiopulmonary disease I agree with the radiologist interpretation    Problem List / ED Course / Critical Interventions / Medication Management  Patient reports feeling short of breath, worse with ambulation that began this morning.  Denies any associated chest pain or fevers.  Occasional cough.  Denies any swelling of the lower  extremities or abdomen.  Denies any recent sick contact. Low suspicion for pneumonia. She is on Eliquis daily.  Denies any recent skip doses.  No recent surgeries, hospitalizations, or long travels. Low suspicion for PE. Patient staffed with my attending, Dr. Drury Geralds, who also evaluated patient herself. I ordered medications including: Lasix  and Potassium for CHF exacerbation  Reevaluation of the patient after these medicines showed that the patient improved.  Patient's oxygen saturation remained above 95% on room air with ambulation - no orthopnea. Patient feels comfortable and safe for discharge home.  Advised close cardiology follow-up.   Social Determinants of Health  Physical activity   Test / Admission - Considered  Discussed findings with patient.  All questions were answered. She is stable and safe for discharge home. Return precautions given.       Final Clinical Impression(s) / ED Diagnoses Final diagnoses:  Acute on chronic combined systolic and diastolic congestive heart failure [facility])    Rx / DC Orders ED Discharge Orders     None         Sonnie Dusky, PA-C 10/26/23 1443    Merdis Stalling, MD 10/28/23 [phone]

## 2023-12-25 ENCOUNTER — Other Ambulatory Visit: Payer: Self-pay

## 2023-12-25 ENCOUNTER — Encounter (HOSPITAL_COMMUNITY): Payer: Self-pay | Admitting: *Deleted

## 2023-12-25 ENCOUNTER — Emergency Department (HOSPITAL_COMMUNITY)

## 2023-12-25 ENCOUNTER — Inpatient Hospital Stay (HOSPITAL_COMMUNITY)

## 2023-12-25 ENCOUNTER — Inpatient Hospital Stay (HOSPITAL_COMMUNITY)
Admission: EM | Admit: 2023-12-25 | Discharge: 2023-12-28 | DRG: 291 | Disposition: A | Discharge status: Home / Self Care | Attending: Internal Medicine | Admitting: Internal Medicine

## 2023-12-25 DIAGNOSIS — Z79890 Hormone replacement therapy (postmenopausal): Secondary | ICD-10-CM | POA: Diagnosis not present

## 2023-12-25 DIAGNOSIS — J9601 Acute respiratory failure with hypoxia: Secondary | ICD-10-CM | POA: Diagnosis present

## 2023-12-25 DIAGNOSIS — Z7902 Long term (current) use of antithrombotics/antiplatelets: Secondary | ICD-10-CM

## 2023-12-25 DIAGNOSIS — Z7984 Long term (current) use of oral hypoglycemic drugs: Secondary | ICD-10-CM | POA: Diagnosis not present

## 2023-12-25 DIAGNOSIS — I480 Paroxysmal atrial fibrillation: Secondary | ICD-10-CM | POA: Diagnosis present

## 2023-12-25 DIAGNOSIS — G4733 Obstructive sleep apnea (adult) (pediatric): Secondary | ICD-10-CM | POA: Diagnosis present

## 2023-12-25 DIAGNOSIS — Z7901 Long term (current) use of anticoagulants: Secondary | ICD-10-CM | POA: Diagnosis not present

## 2023-12-25 DIAGNOSIS — Z6833 Body mass index (BMI) 33.0-33.9, adult: Secondary | ICD-10-CM | POA: Diagnosis not present

## 2023-12-25 DIAGNOSIS — Z95810 Presence of automatic (implantable) cardiac defibrillator: Secondary | ICD-10-CM

## 2023-12-25 DIAGNOSIS — Q211 Atrial septal defect, unspecified: Secondary | ICD-10-CM

## 2023-12-25 DIAGNOSIS — I5021 Acute systolic (congestive) heart failure: Secondary | ICD-10-CM | POA: Diagnosis not present

## 2023-12-25 DIAGNOSIS — I10 Essential (primary) hypertension: Secondary | ICD-10-CM | POA: Diagnosis not present

## 2023-12-25 DIAGNOSIS — E1169 Type 2 diabetes mellitus with other specified complication: Secondary | ICD-10-CM | POA: Diagnosis present

## 2023-12-25 DIAGNOSIS — Z7982 Long term (current) use of aspirin: Secondary | ICD-10-CM

## 2023-12-25 DIAGNOSIS — N1832 Chronic kidney disease, stage 3b: Secondary | ICD-10-CM | POA: Diagnosis present

## 2023-12-25 DIAGNOSIS — N179 Acute kidney failure, unspecified: Secondary | ICD-10-CM | POA: Diagnosis present

## 2023-12-25 DIAGNOSIS — I4510 Unspecified right bundle-branch block: Secondary | ICD-10-CM | POA: Diagnosis not present

## 2023-12-25 DIAGNOSIS — Z5986 Financial insecurity: Secondary | ICD-10-CM

## 2023-12-25 DIAGNOSIS — J9600 Acute respiratory failure, unspecified whether with hypoxia or hypercapnia: Secondary | ICD-10-CM | POA: Diagnosis present

## 2023-12-25 DIAGNOSIS — E039 Hypothyroidism, unspecified: Secondary | ICD-10-CM | POA: Diagnosis present

## 2023-12-25 DIAGNOSIS — I130 Hypertensive heart and chronic kidney disease with heart failure and stage 1 through stage 4 chronic kidney disease, or unspecified chronic kidney disease: Principal | ICD-10-CM | POA: Diagnosis present

## 2023-12-25 DIAGNOSIS — Z7985 Long-term (current) use of injectable non-insulin antidiabetic drugs: Secondary | ICD-10-CM

## 2023-12-25 DIAGNOSIS — E1122 Type 2 diabetes mellitus with diabetic chronic kidney disease: Secondary | ICD-10-CM | POA: Diagnosis present

## 2023-12-25 DIAGNOSIS — E66811 Obesity, class 1: Secondary | ICD-10-CM

## 2023-12-25 DIAGNOSIS — I428 Other cardiomyopathies: Secondary | ICD-10-CM | POA: Diagnosis present

## 2023-12-25 DIAGNOSIS — E66812 Obesity, class 2: Secondary | ICD-10-CM | POA: Diagnosis present

## 2023-12-25 DIAGNOSIS — I5023 Acute on chronic systolic (congestive) heart failure: Secondary | ICD-10-CM | POA: Diagnosis present

## 2023-12-25 DIAGNOSIS — E785 Hyperlipidemia, unspecified: Secondary | ICD-10-CM | POA: Diagnosis present

## 2023-12-25 DIAGNOSIS — Z794 Long term (current) use of insulin: Secondary | ICD-10-CM | POA: Diagnosis not present

## 2023-12-25 DIAGNOSIS — R0602 Shortness of breath: Secondary | ICD-10-CM | POA: Diagnosis present

## 2023-12-25 DIAGNOSIS — I509 Heart failure, unspecified: Principal | ICD-10-CM

## 2023-12-25 DIAGNOSIS — J810 Acute pulmonary edema: Secondary | ICD-10-CM | POA: Diagnosis not present

## 2023-12-25 DIAGNOSIS — R0902 Hypoxemia: Secondary | ICD-10-CM

## 2023-12-25 DIAGNOSIS — Z79899 Other long term (current) drug therapy: Secondary | ICD-10-CM

## 2023-12-25 LAB — CBC WITH DIFFERENTIAL/PLATELET
Abs Immature Granulocytes: 0.02 10*3/uL (ref 0.00–0.07)
Basophils Absolute: 0 10*3/uL (ref 0.0–0.1)
Basophils Relative: 0 %
Eosinophils Absolute: 0 10*3/uL (ref 0.0–0.5)
Eosinophils Relative: 0 %
HCT: 53.1 % — ABNORMAL HIGH (ref 36.0–46.0)
Hemoglobin: 16.3 g/dL — ABNORMAL HIGH (ref 12.0–15.0)
Immature Granulocytes: 0 %
Lymphocytes Relative: 22 %
Lymphs Abs: 1.7 10*3/uL (ref 0.7–4.0)
MCH: 28.1 pg (ref 26.0–34.0)
MCHC: 30.7 g/dL (ref 30.0–36.0)
MCV: 91.6 fL (ref 80.0–100.0)
Monocytes Absolute: 0.7 10*3/uL (ref 0.1–1.0)
Monocytes Relative: 9 %
Neutro Abs: 5.3 10*3/uL (ref 1.7–7.7)
Neutrophils Relative %: 69 %
Platelets: 206 10*3/uL (ref 150–400)
RBC: 5.8 MIL/uL — ABNORMAL HIGH (ref 3.87–5.11)
RDW: 15 % (ref 11.5–15.5)
WBC: 7.8 10*3/uL (ref 4.0–10.5)
nRBC: 0 % (ref 0.0–0.2)

## 2023-12-25 LAB — I-STAT ARTERIAL BLOOD GAS, ED
Acid-Base Excess: 0 mmol/L (ref 0.0–2.0)
Bicarbonate: 24.3 mmol/L (ref 20.0–28.0)
Calcium, Ion: 1.21 mmol/L (ref 1.15–1.40)
HCT: 53 % — ABNORMAL HIGH (ref 36.0–46.0)
Hemoglobin: 18 g/dL — ABNORMAL HIGH (ref 12.0–15.0)
O2 Saturation: 98 %
Patient temperature: 98.5
Potassium: 4.2 mmol/L (ref 3.5–5.1)
Sodium: 144 mmol/L (ref 135–145)
TCO2: 25 mmol/L (ref 22–32)
pCO2 arterial: 38.3 mmHg (ref 32–48)
pH, Arterial: 7.411 (ref 7.35–7.45)
pO2, Arterial: 101 mmHg (ref 83–108)

## 2023-12-25 LAB — CBC
HCT: 53.6 % — ABNORMAL HIGH (ref 36.0–46.0)
Hemoglobin: 16.5 g/dL — ABNORMAL HIGH (ref 12.0–15.0)
MCH: 28.5 pg (ref 26.0–34.0)
MCHC: 30.8 g/dL (ref 30.0–36.0)
MCV: 92.6 fL (ref 80.0–100.0)
Platelets: 161 10*3/uL (ref 150–400)
RBC: 5.79 MIL/uL — ABNORMAL HIGH (ref 3.87–5.11)
RDW: 15 % (ref 11.5–15.5)
WBC: 14 10*3/uL — ABNORMAL HIGH (ref 4.0–10.5)
nRBC: 0 % (ref 0.0–0.2)

## 2023-12-25 LAB — BASIC METABOLIC PANEL WITH GFR
Anion gap: 13 (ref 5–15)
BUN: 38 mg/dL — ABNORMAL HIGH (ref 8–23)
CO2: 21 mmol/L — ABNORMAL LOW (ref 22–32)
Calcium: 9.3 mg/dL (ref 8.9–10.3)
Chloride: 109 mmol/L (ref 98–111)
Creatinine, Ser: 2.34 mg/dL — ABNORMAL HIGH (ref 0.44–1.00)
GFR, Estimated: 22 mL/min — ABNORMAL LOW (ref >60)
Glucose, Bld: 117 mg/dL — ABNORMAL HIGH (ref 70–99)
Potassium: 3.9 mmol/L (ref 3.5–5.1)
Sodium: 143 mmol/L (ref 135–145)

## 2023-12-25 LAB — CREATININE, SERUM
Creatinine, Ser: 2.43 mg/dL — ABNORMAL HIGH (ref 0.44–1.00)
GFR, Estimated: 21 mL/min — ABNORMAL LOW (ref >60)

## 2023-12-25 LAB — ECHOCARDIOGRAM COMPLETE
AR max vel: 2.07 cm2
AV Peak grad: 8.2 mmHg
Ao pk vel: 1.43 m/s
Area-P 1/2: 4.57 cm2
Calc EF: 15 %
Est EF: <20
Height: 59.5 in
S' Lateral: 6 cm
Single Plane A2C EF: 15.8 %
Single Plane A4C EF: 12.2 %
Weight: 2768 [oz_av]

## 2023-12-25 LAB — LACTIC ACID, PLASMA
Lactic Acid, Venous: 2.1 mmol/L (ref 0.5–1.9)
Lactic Acid, Venous: 2.2 mmol/L (ref 0.5–1.9)

## 2023-12-25 LAB — HEMOGLOBIN A1C
Hgb A1c MFr Bld: 5.2 % (ref 4.8–5.6)
Mean Plasma Glucose: 102.54 mg/dL

## 2023-12-25 LAB — GLUCOSE, CAPILLARY
Glucose-Capillary: 90 mg/dL (ref 70–99)
Glucose-Capillary: 98 mg/dL (ref 70–99)
Glucose-Capillary: 107 mg/dL — ABNORMAL HIGH (ref 70–99)

## 2023-12-25 LAB — BRAIN NATRIURETIC PEPTIDE: B Natriuretic Peptide: 2406 pg/mL — ABNORMAL HIGH (ref 0.0–100.0)

## 2023-12-25 LAB — TROPONIN I (HIGH SENSITIVITY)
Troponin I (High Sensitivity): 23 ng/L — ABNORMAL HIGH (ref <18)
Troponin I (High Sensitivity): 22 ng/L — ABNORMAL HIGH (ref <18)

## 2023-12-25 LAB — HIV ANTIBODY (ROUTINE TESTING W REFLEX): HIV Screen 4th Generation wRfx: NONREACTIVE

## 2023-12-25 LAB — MRSA NEXT GEN BY PCR, NASAL: MRSA by PCR Next Gen: NOT DETECTED

## 2023-12-25 LAB — CBG MONITORING, ED: Glucose-Capillary: 78 mg/dL (ref 70–99)

## 2023-12-25 MED ORDER — CHLORHEXIDINE GLUCONATE CLOTH 2 % EX PADS
6.0000 | MEDICATED_PAD | Freq: Every day | CUTANEOUS | Status: DC
  Administered 2023-12-26 – 2023-12-28 (×3): 6 via TOPICAL

## 2023-12-25 MED ORDER — HEPARIN SODIUM (PORCINE) 5000 UNIT/ML IJ SOLN
5000.0000 [IU] | Freq: Three times a day (TID) | INTRAMUSCULAR | Status: DC
  Administered 2023-12-25 – 2023-12-26 (×3): 5000 [IU] via SUBCUTANEOUS
  Filled 2023-12-25 (×3): qty 1

## 2023-12-25 MED ORDER — DOBUTAMINE-DEXTROSE 4-5 MG/ML-% IV SOLN
5.0000 ug/kg/min | INTRAVENOUS | Status: DC
  Filled 2023-12-25: qty 250

## 2023-12-25 MED ORDER — NITROGLYCERIN IN D5W 200-5 MCG/ML-% IV SOLN
0.0000 ug/min | INTRAVENOUS | Status: DC
  Administered 2023-12-25: 5 ug/min via INTRAVENOUS
  Filled 2023-12-25: qty 250

## 2023-12-25 MED ORDER — INSULIN ASPART 100 UNIT/ML IJ SOLN: 0.0000 [IU] | INTRAMUSCULAR | Status: DC

## 2023-12-25 MED ORDER — ORAL CARE MOUTH RINSE
15.0000 mL | OROMUCOSAL | Status: DC
  Administered 2023-12-25 – 2023-12-28 (×10): 15 mL via OROMUCOSAL

## 2023-12-25 MED ORDER — FUROSEMIDE 10 MG/ML IJ SOLN
80.0000 mg | Freq: Two times a day (BID) | INTRAMUSCULAR | Status: DC
  Administered 2023-12-25: 80 mg via INTRAVENOUS
  Filled 2023-12-25 (×2): qty 8

## 2023-12-25 MED ORDER — DOCUSATE SODIUM 100 MG PO CAPS
100.0000 mg | ORAL_CAPSULE | Freq: Two times a day (BID) | ORAL | Status: DC | PRN
Start: 2023-12-25 — End: 2023-12-28

## 2023-12-25 MED ORDER — FUROSEMIDE 10 MG/ML IJ SOLN
60.0000 mg | Freq: Once | INTRAMUSCULAR | Status: AC
  Administered 2023-12-25: 60 mg via INTRAVENOUS
  Filled 2023-12-25: qty 6

## 2023-12-25 MED ORDER — ORAL CARE MOUTH RINSE
15.0000 mL | OROMUCOSAL | Status: DC | PRN
Start: 2023-12-25 — End: 2023-12-27

## 2023-12-25 MED ORDER — POLYETHYLENE GLYCOL 3350 17 G PO PACK: 17.0000 g | PACK | Freq: Every day | ORAL | Status: DC | PRN

## 2023-12-25 NOTE — Progress Notes (Signed)
 Pt taken off BiPAP and placed on HFNC 10L, Pt tolerating well at this time. No increased WOB noted, SpO2 93% RN aware.     12/25/23 1756  Therapy Vitals  Pulse Rate 60  Resp (!) 30  MEWS Score/Color  MEWS Score 2  MEWS Score Color Yellow  Respiratory Assessment  Assessment Type Assess only  Respiratory Pattern Regular;Unlabored  Chest Assessment Chest expansion symmetrical  Cough None  Bilateral Breath Sounds Coarse crackles  Oxygen Therapy/Pulse Ox  O2 Device (S)  HFNC  O2 Therapy Oxygen humidified  O2 Flow Rate (L/min) 10 L/min  SpO2 93 %

## 2023-12-25 NOTE — ED Provider Notes (Signed)
 [place] EMERGENCY DEPARTMENT AT [facility] Provider Note   CSN: [id number] Arrival date & time: 12/25/23  9287     Patient presents with: Shortness of Breath   Veronica Burke is a 68 y.o. female w/ hx of HTN, HLD, CHF presenting with SOB, acutely worsening this morning.  Denies leg swelling.  Normally takes torsemide  10 mg every other day, but took 20 mg this morning.  Denies fevers, cough, chills.  EMS reports pt 86% on Room air on arrival.   HPI     Prior to Admission medications   Medication Sig Start Date End Date Taking? Authorizing Provider  amiodarone  (PACERONE ) 200 MG tablet Take 200 mg by mouth daily. 12/18/18   [provider]  aspirin  EC 81 MG tablet Take 81 mg by mouth daily.    [provider]  atorvastatin (LIPITOR) 40 MG tablet Take 40 mg by mouth daily. 12/13/18   [provider]  clopidogrel  (PLAVIX ) 75 MG tablet Take 75 mg by mouth daily. 12/07/18   [provider]  hydrALAZINE  (APRESOLINE ) 25 MG tablet Take 25 mg by mouth 3 (three) times daily.  01/04/19   [provider]  Insulin  Detemir (LEVEMIR FLEXTOUCH) 100 UNIT/ML Pen Inject 15 Units into the skin at bedtime.    [provider]  iron polysaccharides (NIFEREX) 150 MG capsule Take 150 mg by mouth every Monday, Wednesday, and Friday.    [provider]  isosorbide dinitrate (ISORDIL) 20 MG tablet Take 20 mg by mouth 3 (three) times daily. 12/22/18   [provider]  levothyroxine  (SYNTHROID ) 100 MCG tablet Take 100 mcg by mouth daily before breakfast. 12/22/18   [provider]  liraglutide (VICTOZA) 18 MG/3ML SOPN Inject 1.8 mg into the skin daily.    [provider]  magnesium oxide (MAG-OX) 400 MG tablet Take 400 mg by mouth 2 (two) times daily.  12/08/18   [provider]  milrinone  (PRIMACOR ) 20 MG/100 ML SOLN infusion Inject 0.0207 mg/min into the vein continuous. 01/20/19 02/19/19  Burke, Veronica Daniel, MD   sitaGLIPtin (JANUVIA) 50 MG tablet Take 50 mg by mouth daily.    [provider]  torsemide  (DEMADEX ) 20 MG tablet Take 20 mg by mouth daily. 12/18/18   [provider]    Allergies: Patient has no known allergies.    Review of Systems  Updated Vital Signs BP (!) 148/79 (BP Location: Left Arm)   Pulse 69   Temp 98.9 F (37.2 C) (Axillary)   Resp (!) 26   Ht 4' 11.5 (1.511 m)   Wt 78.5 kg   SpO2 97%   BMI 34.36 kg/m   Physical Exam Constitutional:      General: She is not in acute distress. HENT:     Head: Normocephalic and atraumatic.   Eyes:     Conjunctiva/sclera: Conjunctivae normal.     Pupils: Pupils are equal, round, and reactive to light.    Cardiovascular:     Rate and Rhythm: Normal rate and regular rhythm.  Pulmonary:     Effort: Pulmonary effort is normal. No respiratory distress.     Comments: Rhonchorous breath sounds bilaterally, speaking in full sentences Abdominal:     General: There is no distension.     Tenderness: There is no abdominal tenderness.   Skin:    General: Skin is warm and dry.   Neurological:     General: No focal deficit present.     Mental Status: She is alert.  Mental status is at baseline.   Psychiatric:        Mood and Affect: Mood normal.        Behavior: Behavior normal.     (all labs ordered are listed, but only abnormal results are displayed) Labs Reviewed  BASIC METABOLIC PANEL WITH GFR - Abnormal; Notable for the following components:      Result Value   CO2 21 (*)    Glucose, Bld 117 (*)    BUN 38 (*)    Creatinine, Ser 2.34 (*)    GFR, Estimated 22 (*)    All other components within normal limits  CBC WITH DIFFERENTIAL/PLATELET - Abnormal; Notable for the following components:   RBC 5.80 (*)    Hemoglobin 16.3 (*)    HCT 53.1 (*)    All other components within normal limits  BRAIN NATRIURETIC PEPTIDE - Abnormal; Notable for the following components:   B Natriuretic Peptide 2,406.0 (*)     All other components within normal limits  CBC - Abnormal; Notable for the following components:   WBC 14.0 (*)    RBC 5.79 (*)    Hemoglobin 16.5 (*)    HCT 53.6 (*)    All other components within normal limits  CREATININE, SERUM - Abnormal; Notable for the following components:   Creatinine, Ser 2.43 (*)    GFR, Estimated 21 (*)    All other components within normal limits  LACTIC ACID, PLASMA - Abnormal; Notable for the following components:   Lactic Acid, Venous 2.1 (*)    All other components within normal limits  I-STAT ARTERIAL BLOOD GAS, ED - Abnormal; Notable for the following components:   HCT 53.0 (*)    Hemoglobin 18.0 (*)    All other components within normal limits  TROPONIN I (HIGH SENSITIVITY) - Abnormal; Notable for the following components:   Troponin I (High Sensitivity) 23 (*)    All other components within normal limits  TROPONIN I (HIGH SENSITIVITY) - Abnormal; Notable for the following components:   Troponin I (High Sensitivity) 22 (*)    All other components within normal limits  MRSA NEXT GEN BY PCR, NASAL  GLUCOSE, CAPILLARY  HIV ANTIBODY (ROUTINE TESTING W REFLEX)  BLOOD GAS, ARTERIAL  HEMOGLOBIN A1C  LACTIC ACID, PLASMA  CBG MONITORING, ED    EKG: EKG Interpretation Date/Time:  Sunday December 25 2023 07:31:47 EDT Ventricular Rate:  74 PR Interval:  120 QRS Duration:  206 QT Interval:  522 QTC Calculation: 579 R Axis:   204  Text Interpretation: Paced rhythm, artifact Abnormal ECG When compared with ECG of 26-Oct-2023 08:07, PREVIOUS ECG IS PRESENT Confirmed by Gaddiel Cullens ([id number]) on 12/25/2023 7:40:23 AM  Radiology: ECHOCARDIOGRAM COMPLETE Result Date: 12/25/2023    ECHOCARDIOGRAM REPORT   Patient Name:   Veronica Burke Date of Exam: 12/25/2023 Medical Rec #:  [id number]   Height:       59.5 in Accession #:    [id number]  Weight:       173.0 lb Date of Birth:  07/18/1955  BSA:          1.744 m Patient Age:    67 years    BP:           134/84  mmHg Patient Gender: F           HR:           11 5 bpm. Exam Location:  Inpatient Procedure: 2D Echo, Cardiac Doppler and Color Doppler (Both Spectral  and Color            Flow Doppler were utilized during procedure). Indications:    CHF - Acute Systolic I50.21  History:        Patient has no prior history of Echocardiogram examinations.                 CHF, CKD, stage 3; Risk Factors:Diabetes.  Sonographer:    Thea Norlander RCS Referring Phys: [facility]ENTON BIRCH HARRIS IMPRESSIONS  1. Left ventricular ejection fraction, by estimation, is <20%. The left ventricle has severely decreased function. The left ventricle demonstrates global hypokinesis. The left ventricular internal cavity size was severely dilated. Left ventricular diastolic function could not be evaluated due to mitral annular calcification (moderate or greater) and possible repair. But findings suggestive of Grade III diastolic dysfunction (restrictive).. Elevated left atrial pressure. The E/e' is 29.  2. Right ventricular systolic function is low normal. The right ventricular size is normal.  3. Left atrial size was grossly normal.  4. Right atrial size was grossly normal.  5. Mitral valve degenerative and possibly repaired (s/p Mitral Clip at A2/P3 scallop best seen on apical views), severe MR (jet directed central and eccentric to the lateral wall), no valvular stenosis. . Severe mitral annular calcification.  6. The aortic valve is tricuspid. Aortic valve regurgitation is moderate. No aortic stenosis is present.  7. The inferior vena cava is normal in size with greater than 50% respiratory variability, suggesting right atrial pressure of 3 mmHg. Comparison(s): No prior Echocardiogram. FINDINGS  Left Ventricle: Left ventricular ejection fraction, by estimation, is <20%. The left ventricle has severely decreased function. The left ventricle demonstrates global hypokinesis. The left ventricular internal cavity size was severely dilated. There is no left  ventricular hypertrophy. Left ventricular diastolic function could not be evaluated due to mitral annular calcification (moderate or greater) and possible repair. But findings suggestive of Grade III diastolic dysfunction (restrictive). Elevated left atrial pressure. The E/e' is 2. Right Ventricle: The right ventricular size is normal. Right vetricular wall thickness was not well visualized. Right ventricular systolic function is low normal. Left Atrium: Left atrial size was grossly normal. Right Atrium: Right atrial size was grossly normal. Pericardium: There is no evidence of pericardial effusion. Mitral Valve: Mitral valve degenerative and possibly repaired (s/p Mitral Clip at A2/P3 scallop best seen on apical views), severe MR (jet directed central and eccentric to the lateral wall), no valvular stenosis. Severe mitral annular calcification. Tricuspid Valve: The tricuspid valve is grossly normal. Tricuspid valve regurgitation is trivial. No evidence of tricuspid stenosis. Aortic Valve: The aortic valve is tricuspid. Aortic valve regurgitation is moderate. No aortic stenosis is present. Aortic valve peak gradient measures 8.2 mmHg. Pulmonic Valve: The pulmonic valve was not well visualized. Aorta: The aortic root and ascending aorta are structurally normal, with no evidence of dilitation. Venous: The inferior vena cava is normal in size with greater than 50% respiratory variability, suggesting right atrial pressure of 3 mmHg. IAS/Shunts: There is right bowing of the interatrial septum, suggestive of elevated left atrial pressure. The atrial septum is grossly normal. Additional Comments: A device lead is visualized.  LEFT VENTRICLE PLAX 2D LVIDd:         6.40 cm      Diastology LVIDs:         6.00 cm      LV e' medial:    6.64 cm/s LV PW:         0.80 cm  LV E/e' medial:  23.9 LV IVS:        0.70 cm      LV e' lateral:   4.79 cm/s LVOT diam:     2.10 cm      LV E/e' lateral: 33.2 LV SV:         52 LV SV  Index:   30 LVOT Area:     3.46 cm  LV Volumes (MOD) LV vol d, MOD A2C: 171.0 ml LV vol d, MOD A4C: 181.0 ml LV vol s, MOD A2C: 144.0 ml LV vol s, MOD A4C: 159.0 ml LV SV MOD A2C:     27.0 ml LV SV MOD A4C:     181.0 ml LV SV MOD BP:      26.9 ml RIGHT VENTRICLE         IVC TAPSE (M-mode): 2.0 cm  IVC diam: 1.80 cm LEFT ATRIUM         Index LA diam:    4.00 cm 2.29 cm/m  AORTIC VALVE AV Area (Vmax): 2.07 cm AV Vmax:        143.00 cm/s AV Peak Grad:   8.2 mmHg LVOT Vmax:      85.60 cm/s LVOT Vmean:     56.000 cm/s LVOT VTI:       0.151 m  AORTA Ao Root diam: 3.10 cm Ao Asc diam:  3.60 cm MITRAL VALVE MV Area (PHT): 4.57 cm     SHUNTS MV Decel Time: 166 msec     Systemic VTI:  0.15 m MV E velocity: 159.00 cm/s  Systemic Diam: 2.10 cm MV A velocity: 46.40 cm/s MV E/A ratio:  3.43 Sunit Tolia Electronically signed by Madonna Large Signature Date/Time: 12/25/2023/1:38:43 PM    Final    DG Chest Portable 1 View Result Date: 12/25/2023 CLINICAL DATA:  Shortness of breath chest rate EXAM: PORTABLE CHEST 1 VIEW COMPARISON:  Graft dated 10/26/2023 FINDINGS: Lines/tubes: Left chest wall ICD leads project over the right atrium and ventricle and tributary of the coronary sinus. Nerve stimulator projects over the right chest. Lead terminates above the field of view. Lungs: Low lung volumes with bronchovascular crowding. Diffuse bilateral patchy and interstitial opacities. Pleura: The left costophrenic angle is obscured. No definite pneumothorax or pleural effusion. Heart/mediastinum: Similar enlarged cardiomediastinal silhouette. Bones: No acute osseous abnormality. IMPRESSION: 1. Diffuse bilateral patchy and interstitial opacities, which may represent pulmonary edema or multifocal infection. 2. Similar cardiomegaly. Electronically Signed   By: Limin  Xu M.D.   On: 12/25/2023 08:21     .Critical Care  Performed by: Cottie Donnice PARAS, MD Authorized by: Cottie Donnice PARAS, MD   Critical care provider statement:     Critical care time (minutes):  45   Critical care time was exclusive of:  Separately billable procedures and treating other patients   Critical care was necessary to treat or prevent imminent or life-threatening deterioration of the following conditions:  Respiratory failure   Critical care was time spent personally by me on the following activities:  Ordering and performing treatments and interventions, ordering and review of laboratory studies, ordering and review of radiographic studies, pulse oximetry, review of old charts, examination of patient and evaluation of patient's response to treatment Comments:     CHF diuresis, bipap for hypoxia    Medications Ordered in the ED  docusate sodium (COLACE) capsule 100 mg (has no administration in time range)  polyethylene glycol (MIRALAX / GLYCOLAX) packet 17 g (has no administration in time range)  heparin  injection 5,000 Units (5,000 Units Subcutaneous Given 12/25/23 1424)  insulin  aspart (novoLOG ) injection 0-9 Units ( Subcutaneous Not Given 12/25/23 1535)  furosemide  (LASIX ) injection 80 mg (80 mg Intravenous Given 12/25/23 1539)  furosemide  (LASIX ) injection 60 mg (60 mg Intravenous Given 12/25/23 0856)    Clinical Course as of 12/25/23 1553  Sun Dec 25, 2023  0834 Started on bipap [MT]  0902 B Natriuretic Peptide(!): 2,406.0 [MT]  0943 Pt still hypoxic on bipap, sating 90-92% on 100% FIO2 - hasn't started urinating with lasix , I will discuss with pulm crit team.  Xray to me is more consistent with pulm edema than PNA - no fever or leukocytosis.  She is compliant on eliquis, lower suspicion for acute PE [MT]  0945 Symptomatically she feels better on bipap and RR improved, along with WOB - I don't think we need to move to intubation at this time [MT]  0950 Spoke to [employer] Crit for consult [MT]  1041 Admitted ICU [MT]    Clinical Course User Index [MT] Cottie Donnice PARAS, MD                                 Medical Decision  Making Amount and/or Complexity of Data Reviewed Labs: ordered. Decision-making details documented in ED Course. Radiology: ordered. ECG/medicine tests: ordered.  Risk Prescription drug management. Decision regarding hospitalization.   This patient presents to the ED with concern for SOB. This involves an extensive number of treatment options, and is a complaint that carries with it a high risk of complications and morbidity.  The differential diagnosis includes CHF vs arrhythmia vs anemia vs atypical ACS vs PNA vs PTX vs other  Co-morbidities that complicate the patient evaluation: hx of CHF  Additional history obtained from EMS  External records from outside source obtained and reviewed including [facility] cardiology note 11/11/23 noting hx of biventricular medtronic defibrillator implated 2022, severe mitral regurg, echo 30-35% EF, aprox A Fib on amiodarone  and eliquis  I ordered and personally interpreted labs.  The pertinent results include: BNP elevated.  Delta troponin low and flat.  Chronic kidney disease noted with creatinine elevation  I ordered imaging studies including dg chest I independently visualized and interpreted imaging which showed suspected pulmonary edema pattern I agree with the radiologist interpretation  The patient was maintained on a cardiac monitor.  I personally viewed and interpreted the cardiac monitored which showed an underlying rhythm of: Paced rhythm  Per my interpretation the patient's ECG shows no acute ischemic findings; paced rhythm  I ordered medication including IV diuresis, BiPAP, nitroglycerin for suspected pulmonary edema, hypoxia, CHF exacerbation  I have reviewed the patients home medicines and have made adjustments as needed  Test Considered: Lower suspicion for sepsis or acute PE in this clinical setting.  Doubt ACS or aortic dissection.  I have a low suspicion for sepsis at this time or infection including pneumonia.  I requested  consultation with the critical care,  and discussed lab and imaging findings as well as pertinent plan - they recommend: admission  After the interventions noted above, I reevaluated the patient and found that they have: stayed the same     Disposition:  After consideration of the diagnostic results and the patient's response to treatment, I feel that the patient would benefit from medical admission.      Final diagnoses:  Congestive heart failure, unspecified HF chronicity, unspecified heart failure type (  [facility])  Hypoxia    ED Discharge Orders     None          Cottie Donnice PARAS, MD 12/25/23 1553

## 2023-12-25 NOTE — Progress Notes (Signed)
 Echocardiogram 2D Echocardiogram has been performed.  Veronica Burke 12/25/2023, 12:35 PM

## 2023-12-25 NOTE — Progress Notes (Signed)
 Arrived to ICU. Breathing a little more comfortably on bipap. Making good urine. Will give her the second dose of IV lasix . Off nitroglycerin gtt. Can continue bipap for another 2 hours before giving a break. Recommend she wear it tonight, especially in the setting of her osa.   Additional cc time 15 minutes.

## 2023-12-25 NOTE — H&P (Addendum)
 NAME:  Veronica Burke, MRN:  [id number], DOB:  04-Jun-1956, LOS: 0 ADMISSION DATE:  12/25/2023, CONSULTATION DATE:  12/25/2023 REFERRING MD:  Dr. Cottie - EDP, CHIEF COMPLAINT: Acute hypoxic respiratory failure in setting of acute CHF exacerbation  History of Present Illness:  Veronica Burke is a 68 year old female with a past medical history significant for nonischemic cardiomyopathy with ICD in place, type 2 diabetes, HTN, HLD, hypothyroidism, OSA mitral valve regurgitation s/p mitral clip who presented to the ED at [facility] 6/22 with complaints of shortness of breath that began on day of admission.  Patient states she took additional home diuretic with no improvement seen.  On EMS arrival patient was seen 86% on room air.  On ED arrival patient was seen tachypneic and mildly hypertensive initially placed on nonrebreather breather but escalated to BiPAP and now remains BiPAP dependent.  Lab work significant for CO2 21, glucose 117, creatinine 2.34 with GFR 22, BNP 2406 sensitivity troponin 23 > 22, hemoglobin 16.3.  X-ray consistent with bilateral patchy and interstitial opacities concerning for pulmonary edema unable to exclude multifocal infection.  Given BiPAP dependence in the setting of likely acute exacerbation of CHF PCCM consulted for further management admission  Pertinent  Medical History  Nonischemic cardiomyopathy with ICD in place, type 2 diabetes, HTN, HLD, hypothyroidism, OSA mitral valve regurgitation s/p mitral clip   Significant Hospital Events: Including procedures, antibiotic start and stop dates in addition to other pertinent events   6/21 presented with acute onset shortness of breath appears to be in acute exacerbation CHF with volume overload remains BiPAP dependent  Interim History / Subjective:  Seen comfortably sitting up in bed on BiPAP mask slightly lethargic  Objective    Blood pressure 134/84, pulse 68, temperature 97.7 F (36.5 C), temperature source Oral, resp. rate  (!) 27, height 4' 11.5 (1.511 m), weight 78.5 kg, SpO2 91%.    FiO2 (%):  [70 %] 70 %  No intake or output data in the 24 hours ending 12/25/23 1021 Filed Weights   12/25/23 0718  Weight: 78.5 kg    Examination: General: Acute on chronic ill-appearing deconditioned middle-aged female lying in bed on BiPAP in no acute distress HEENT: [place]/AT, BiPAP mask in place, MM pink/moist, PERRL,  Neuro: Alert and oriented x 3, nonfocal CV: s1s2 regular rate and rhythm, no murmur, rubs, or gallops,  PULM: Diminished air entry bilaterally, no increased work of breathing, tolerating BiPAP GI: soft, bowel sounds active in all 4 quadrants, non-tender, non-distended Extremities: warm/dry, no edema  Skin: no rashes or lesions   Resolved problem list   Assessment and Plan  Acute hypoxic respiratory failure Bilateral pulmonary edema History of OSA -In the setting of acute CHF exacerbation P: Continue BiPAP Admit to ICU for close monitoring of airway Check ABG Aspiration precaution N.p.o.  Diurese as below Follow cultures  Acute exacerbation of combined systolic and diastolic congestive heart failure - Per care everywhere patient has known history of both systolic and diastolic congestive heart failure with most recent EF 30 to 35% Essential hypertension Presence of biventricular defibrillator Severe mitral regurgitation s/p mitral clip P: Continuous telemetry  Nitro drip Trend BNP N.p.o. while on BiPAP Strict intake and output  Daily weight to assess volume status Aggressively diurese, received 60 Lasix  in ED, given diminished renal function my need higher dose, will discuss with attending  Closely monitor renal function and electrolytes  BiPAP as above Ensure hemodynamic control  Resume home GDMT regimen as able  Paroxysmal  atrial fibrillation - At baseline she utilizes Toprol-XL for rate control and apixaban for anticoagulation P: Continuous telemetry Consider Heparin  drip while  n.p.o.   CKD stage IV - Creatinine on admission 2.34 with GFR 22 which appears to be near patient's baseline P: Follow renal function  Monitor urine output Trend Bmet Avoid nephrotoxins Ensure adequate renal perfusion  Diurese as above  Type 2 diabetes -Medication includes Januvia P: SSI CBG goal 1 4180 CBG checks every 4  Best Practice (right click and Reselect all SmartList Selections daily)   Diet/type: NPO DVT prophylaxis prophylactic heparin   Pressure ulcer(s): N/A GI prophylaxis: PPI Lines: N/A Foley:  N/A Code Status:  full code Last date of multidisciplinary goals of care discussion: Continue to update patient and family daily   Labs   CBC: Recent Labs  Lab 12/25/23 0755  WBC 7.8  NEUTROABS 5.3  HGB 16.3*  HCT 53.1*  MCV 91.6  PLT 206    Basic Metabolic Panel: Recent Labs  Lab 12/25/23 0755  NA 143  K 3.9  CL 109  CO2 21*  GLUCOSE 117*  BUN 38*  CREATININE 2.34*  CALCIUM 9.3   GFR: Estimated Creatinine Clearance: 20.8 mL/min (A) (by C-G formula based on SCr of 2.34 mg/dL (H)). Recent Labs  Lab 12/25/23 0755  WBC 7.8    Liver Function Tests: No results for input(s): AST, ALT, ALKPHOS, BILITOT, PROT, ALBUMIN in the last 168 hours. No results for input(s): LIPASE, AMYLASE in the last 168 hours. No results for input(s): AMMONIA in the last 168 hours.  ABG    Component Value Date/Time   PHART 7.485 (H) 01/16/2019 0347   PCO2ART 33.3 01/16/2019 0347   PO2ART 101.0 01/16/2019 0347   HCO3 25.2 01/16/2019 0347   TCO2 26 01/16/2019 0347   O2SAT 74.7 01/19/2019 0245     Coagulation Profile: No results for input(s): INR, PROTIME in the last 168 hours.  Cardiac Enzymes: No results for input(s): CKTOTAL, CKMB, CKMBINDEX, TROPONINI in the last 168 hours.  HbA1C: Hgb A1c MFr Bld  Date/Time Value Ref Range Status  01/15/2019 03:20 PM 5.0 4.8 - 5.6 % Final    Comment:    (NOTE) Pre diabetes:           5.7%-6.4% Diabetes:              >6.4% Glycemic control for   <7.0% adults with diabetes     CBG: No results for input(s): GLUCAP in the last 168 hours.  Review of Systems:   Unable to assess   Past Medical History:  She,  has a past medical history of ASD (atrial septal defect), Chronic systolic CHF (congestive heart failure) (HCC), CKD (chronic kidney disease), stage III (HCC), DM (diabetes mellitus) (HCC), Dyslipidemia, Essential hypertension, Hypothyroid, ICD (implantable cardioverter-defibrillator) in place, Mitral regurgitation, NICM (nonischemic cardiomyopathy) (HCC), NSVT (nonsustained ventricular tachycardia) (HCC), Obesity, OSA (obstructive sleep apnea), and S/P mitral valve clip implantation.   Surgical History:   Past Surgical History:  Procedure Laterality Date   ICD IMPLANT     MitraClip       Social History:   reports that she has never smoked. She has never used smokeless tobacco.   Family History:  Her Family history is unknown by patient.   Allergies No Known Allergies   Home Medications  Prior to Admission medications   Medication Sig Start Date End Date Taking? Authorizing Provider  amiodarone  (PACERONE ) 200 MG tablet Take 200 mg by mouth daily. 12/18/18  [provider]  aspirin  EC 81 MG tablet Take 81 mg by mouth daily.    [provider]  atorvastatin (LIPITOR) 40 MG tablet Take 40 mg by mouth daily. 12/13/18   [provider]  clopidogrel  (PLAVIX ) 75 MG tablet Take 75 mg by mouth daily. 12/07/18   [provider]  hydrALAZINE  (APRESOLINE ) 25 MG tablet Take 25 mg by mouth 3 (three) times daily.  01/04/19   [provider]  Insulin  Detemir (LEVEMIR FLEXTOUCH) 100 UNIT/ML Pen Inject 15 Units into the skin at bedtime.    [provider]  iron polysaccharides (NIFEREX) 150 MG capsule Take 150 mg by mouth every Monday, Wednesday, and Friday.    [provider]  isosorbide dinitrate (ISORDIL) 20 MG  tablet Take 20 mg by mouth 3 (three) times daily. 12/22/18   [provider]  levothyroxine  (SYNTHROID ) 100 MCG tablet Take 100 mcg by mouth daily before breakfast. 12/22/18   [provider]  liraglutide (VICTOZA) 18 MG/3ML SOPN Inject 1.8 mg into the skin daily.    [provider]  magnesium oxide (MAG-OX) 400 MG tablet Take 400 mg by mouth 2 (two) times daily.  12/08/18   [provider]  milrinone  (PRIMACOR ) 20 MG/100 ML SOLN infusion Inject 0.0207 mg/min into the vein continuous. 01/20/19 02/19/19  Arrien, Mauricio Daniel, MD  sitaGLIPtin (JANUVIA) 50 MG tablet Take 50 mg by mouth daily.    [provider]  torsemide  (DEMADEX ) 20 MG tablet Take 20 mg by mouth daily. 12/18/18   [provider]     Critical care time:   CRITICAL CARE Performed by: Lacey Wallman D. Harris   Total critical care time: 42 minutes  Critical care time was exclusive of separately billable procedures and treating other patients.  Critical care was necessary to treat or prevent imminent or life-threatening deterioration.  Critical care was time spent personally by me on the following activities: development of treatment plan with patient and/or surrogate as well as nursing, discussions with consultants, evaluation of patient's response to treatment, examination of patient, obtaining history from patient or surrogate, ordering and performing treatments and interventions, ordering and review of laboratory studies, ordering and review of radiographic studies, pulse oximetry and re-evaluation of patient's condition.  Arie Gable D. Harris, NP-C [facility] Personal contact information can be found on Amion  If no contact or response made please call [phone] 12/25/2023, 10:52 AM

## 2023-12-25 NOTE — Progress Notes (Signed)
   12/25/23 2325  BiPAP/CPAP/SIPAP  BiPAP/CPAP/SIPAP Pt Type Adult  BiPAP/CPAP/SIPAP SERVO  Mask Type Full face mask  Dentures removed? Not applicable  Mask Size Large  Set Rate 28 breaths/min  Respiratory Rate 28 breaths/min  EPAP 5 cmH2O  Pressure Support 12 cmH20  FiO2 (%) 80 %  Minute Ventilation 9.5  Peak Inspiratory Pressure (PIP) 18  Tidal Volume (Vt) 388  Patient Home Machine No  Patient Home Mask No  Patient Home Tubing No  Auto Titrate No  Press High Alarm 28 cmH2O  CPAP/SIPAP surface wiped down Yes  Device Plugged into RED Power Outlet Yes  Oxygen Percent 95 %  BiPAP/CPAP /SiPAP Vitals  Pulse Rate 63  Resp (!) 28  BP 117/64  SpO2 95 %  Bilateral Breath Sounds Diminished;Coarse crackles  MEWS Score/Color  MEWS Score 2  MEWS Score Color Yellow

## 2023-12-25 NOTE — Plan of Care (Signed)
  Problem: Coping: Goal: Ability to adjust to condition or change in health will improve Outcome: Progressing   Problem: Metabolic: Goal: Ability to maintain appropriate glucose levels will improve Outcome: Progressing   Problem: Skin Integrity: Goal: Risk for impaired skin integrity will decrease Outcome: Progressing   Problem: Clinical Measurements: Goal: Ability to maintain clinical measurements within normal limits will improve Outcome: Progressing Goal: Will remain free from infection Outcome: Progressing Goal: Diagnostic test results will improve Outcome: Progressing Goal: Respiratory complications will improve Outcome: Progressing Goal: Cardiovascular complication will be avoided Outcome: Progressing   Problem: Coping: Goal: Level of anxiety will decrease Outcome: Progressing   Problem: Elimination: Goal: Will not experience complications related to bowel motility Outcome: Progressing Goal: Will not experience complications related to urinary retention Outcome: Progressing   Problem: Pain Managment: Goal: General experience of comfort will improve and/or be controlled Outcome: Progressing   Problem: Safety: Goal: Ability to remain free from injury will improve Outcome: Progressing   Problem: Skin Integrity: Goal: Risk for impaired skin integrity will decrease Outcome: Progressing

## 2023-12-25 NOTE — Progress Notes (Signed)
 Patient transported from ED to 2M09 on BIPAP.

## 2023-12-25 NOTE — ED Triage Notes (Signed)
 Patient presents to ED via [facility] from home c/o sob this am states she took a extra torsemide  this am  patient was 86% on RA placed on NRB sats increased to 96% rales throughout. States she takes torsemide10 mg every other day. Patient is able to speak in complete sentences.

## 2023-12-26 DIAGNOSIS — J810 Acute pulmonary edema: Secondary | ICD-10-CM

## 2023-12-26 DIAGNOSIS — J9601 Acute respiratory failure with hypoxia: Secondary | ICD-10-CM | POA: Diagnosis not present

## 2023-12-26 DIAGNOSIS — G4733 Obstructive sleep apnea (adult) (pediatric): Secondary | ICD-10-CM | POA: Diagnosis not present

## 2023-12-26 LAB — BASIC METABOLIC PANEL WITH GFR
Anion gap: 10 (ref 5–15)
Anion gap: 21 — ABNORMAL HIGH (ref 5–15)
BUN: 39 mg/dL — ABNORMAL HIGH (ref 8–23)
BUN: 40 mg/dL — ABNORMAL HIGH (ref 8–23)
CO2: 24 mmol/L (ref 22–32)
CO2: 22 mmol/L (ref 22–32)
Calcium: 8.7 mg/dL — ABNORMAL LOW (ref 8.9–10.3)
Calcium: 9.1 mg/dL (ref 8.9–10.3)
Chloride: 112 mmol/L — ABNORMAL HIGH (ref 98–111)
Chloride: 103 mmol/L (ref 98–111)
Creatinine, Ser: 2.28 mg/dL — ABNORMAL HIGH (ref 0.44–1.00)
Creatinine, Ser: 2.32 mg/dL — ABNORMAL HIGH (ref 0.44–1.00)
GFR, Estimated: 23 mL/min — ABNORMAL LOW (ref >60)
GFR, Estimated: 22 mL/min — ABNORMAL LOW (ref >60)
Glucose, Bld: 103 mg/dL — ABNORMAL HIGH (ref 70–99)
Glucose, Bld: 97 mg/dL (ref 70–99)
Potassium: 4.1 mmol/L (ref 3.5–5.1)
Potassium: 4.4 mmol/L (ref 3.5–5.1)
Sodium: 146 mmol/L — ABNORMAL HIGH (ref 135–145)
Sodium: 146 mmol/L — ABNORMAL HIGH (ref 135–145)

## 2023-12-26 LAB — CBC
HCT: 52.1 % — ABNORMAL HIGH (ref 36.0–46.0)
Hemoglobin: 15.7 g/dL — ABNORMAL HIGH (ref 12.0–15.0)
MCH: 27.7 pg (ref 26.0–34.0)
MCHC: 30.1 g/dL (ref 30.0–36.0)
MCV: 92 fL (ref 80.0–100.0)
Platelets: 135 10*3/uL — ABNORMAL LOW (ref 150–400)
RBC: 5.66 MIL/uL — ABNORMAL HIGH (ref 3.87–5.11)
RDW: 14.8 % (ref 11.5–15.5)
WBC: 17.2 10*3/uL — ABNORMAL HIGH (ref 4.0–10.5)
nRBC: 0 % (ref 0.0–0.2)

## 2023-12-26 LAB — GLUCOSE, CAPILLARY
Glucose-Capillary: 109 mg/dL — ABNORMAL HIGH (ref 70–99)
Glucose-Capillary: 85 mg/dL (ref 70–99)
Glucose-Capillary: 123 mg/dL — ABNORMAL HIGH (ref 70–99)
Glucose-Capillary: 116 mg/dL — ABNORMAL HIGH (ref 70–99)
Glucose-Capillary: 157 mg/dL — ABNORMAL HIGH (ref 70–99)
Glucose-Capillary: 107 mg/dL — ABNORMAL HIGH (ref 70–99)

## 2023-12-26 LAB — PHOSPHORUS: Phosphorus: 5.2 mg/dL — ABNORMAL HIGH (ref 2.5–4.6)

## 2023-12-26 LAB — PROCALCITONIN: Procalcitonin: 3.51 ng/mL

## 2023-12-26 LAB — MAGNESIUM: Magnesium: 2.2 mg/dL (ref 1.7–2.4)

## 2023-12-26 MED ORDER — ATORVASTATIN CALCIUM 40 MG PO TABS
40.0000 mg | ORAL_TABLET | Freq: Every day | ORAL | Status: DC
  Administered 2023-12-26 – 2023-12-28 (×3): 40 mg via ORAL
  Filled 2023-12-26 (×3): qty 1

## 2023-12-26 MED ORDER — INSULIN ASPART 100 UNIT/ML IJ SOLN
0.0000 [IU] | Freq: Three times a day (TID) | INTRAMUSCULAR | Status: DC
  Administered 2023-12-26: 1 [IU] via SUBCUTANEOUS

## 2023-12-26 MED ORDER — FUROSEMIDE 10 MG/ML IJ SOLN
80.0000 mg | Freq: Four times a day (QID) | INTRAMUSCULAR | Status: AC
  Administered 2023-12-26 (×2): 80 mg via INTRAVENOUS
  Filled 2023-12-26: qty 8

## 2023-12-26 MED ORDER — APIXABAN 5 MG PO TABS
5.0000 mg | ORAL_TABLET | Freq: Two times a day (BID) | ORAL | Status: DC
  Administered 2023-12-26 – 2023-12-28 (×5): 5 mg via ORAL
  Filled 2023-12-26 (×5): qty 1

## 2023-12-26 NOTE — Progress Notes (Signed)
 NAME:  Veronica Burke, MRN:  [id number], DOB:  12-05-1955, LOS: 1 ADMISSION DATE:  12/25/2023, CONSULTATION DATE:  12/25/2023 REFERRING MD:  Dr. Cottie - EDP, CHIEF COMPLAINT: Acute hypoxic respiratory failure in setting of acute CHF exacerbation  History of Present Illness:  Veronica Burke is a 68 year old female with a past medical history significant for nonischemic cardiomyopathy with ICD in place, type 2 diabetes, HTN, HLD, hypothyroidism, OSA mitral valve regurgitation s/p mitral clip who presented to the ED at [facility] 6/22 with complaints of shortness of breath that began on day of admission.  Patient states she took additional home diuretic with no improvement seen.  On EMS arrival patient was seen 86% on room air.  On ED arrival patient was seen tachypneic and mildly hypertensive initially placed on nonrebreather breather but escalated to BiPAP and now remains BiPAP dependent.  Lab work significant for CO2 21, glucose 117, creatinine 2.34 with GFR 22, BNP 2406 sensitivity troponin 23 > 22, hemoglobin 16.3.  X-ray consistent with bilateral patchy and interstitial opacities concerning for pulmonary edema unable to exclude multifocal infection.  Given BiPAP dependence in the setting of likely acute exacerbation of CHF PCCM consulted for further management admission  Pertinent  Medical History  Nonischemic cardiomyopathy with ICD in place, type 2 diabetes, HTN, HLD, hypothyroidism, OSA mitral valve regurgitation s/p mitral clip   Significant Hospital Events: Including procedures, antibiotic start and stop dates in addition to other pertinent events   6/21 presented with acute onset shortness of breath appears to be in acute exacerbation CHF with volume overload remains BiPAP dependent  Interim History / Subjective:  Good diuresis, Cr slightly better, near baseline, now on [place]  Objective    Blood pressure (!) 122/98, pulse (!) 59, temperature 97.7 F (36.5 C), temperature source Oral, resp. rate  20, height 4' 11.5 (1.511 m), weight 78.3 kg, SpO2 92%.    FiO2 (%):  [60 %-90 %] 60 % Pressure Support:  [12 cmH20] 12 cmH20   Intake/Output Summary (Last 24 hours) at 12/26/2023 1032 Last data filed at 12/26/2023 0300 Gross per 24 hour  Intake 0 ml  Output 2625 ml  Net -2625 ml   Filed Weights   12/25/23 0718 12/25/23 1530  Weight: 78.5 kg 78.3 kg    Examination: General: Acute on chronic ill-appearing deconditioned middle-aged female lying in bed on [place] in no acute distress HEENT: [place]/AT, BiPAP mask in place, MM pink/moist, PERRL,  Neuro: Alert and oriented x 3, nonfocal CV: s1s2 regular rate and rhythm, no murmur, rubs, or gallops,  PULM:  no increased work of breathing, speaking in full sentences GI: ND   Resolved problem list   Assessment and Plan  Acute hypoxic respiratory failure Bilateral pulmonary edema History of OSA -In the setting of acute CHF exacerbation BiPAP qhs Aggressive diuresis with IV lasix  BID, good output 6/22  Acute exacerbation of combined systolic and diastolic congestive heart failure - Per care everywhere patient has known history of both systolic and diastolic congestive heart failure with most recent EF 30 to 35%, not candidate for advance therapies at [facility] and [facility], was on milrinone  stopped 2020 after line infection, receives care via [facility] Essential hypertension Presence defibrillator Severe mitral regurgitation s/p mitral clip P: Hold metoprolol and Entresto with borderline BPs  Paroxysmal atrial fibrillation - At baseline she utilizes Toprol-XL for rate control and apixaban for anticoagulation P: Resume apixaban, hold metoprolol  CKD stage IV - Creatinine on admission 2.34 with GFR 22 which appears  to be near patient's baseline P: Diurese  Type 2 diabetes -Medication includes Januvia P: SSI CBG goal 1 4180   Best Practice (right click and Reselect all SmartList Selections daily)   Diet/type: Regular consistency (see  orders) DVT prophylaxis DOAC Pressure ulcer(s): N/A GI prophylaxis: PPI Lines: N/A Foley:  N/A Code Status:  full code Last date of multidisciplinary goals of care discussion: Continue to update patient and family daily   Labs   CBC: Recent Labs  Lab 12/25/23 0755 12/25/23 1020 12/25/23 1102 12/26/23 0335  WBC 7.8 14.0*  --  17.2*  NEUTROABS 5.3  --   --   --   HGB 16.3* 16.5* 18.0* 15.7*  HCT 53.1* 53.6* 53.0* 52.1*  MCV 91.6 92.6  --  92.0  PLT 206 161  --  135*    Basic Metabolic Panel: Recent Labs  Lab 12/25/23 0755 12/25/23 1020 12/25/23 1102 12/26/23 0335  NA 143  --  144 146*  K 3.9  --  4.2 4.1  CL 109  --   --  112*  CO2 21*  --   --  24  GLUCOSE 117*  --   --  103*  BUN 38*  --   --  39*  CREATININE 2.34* 2.43*  --  2.28*  CALCIUM 9.3  --   --  8.7*  MG  --   --   --  2.2  PHOS  --   --   --  5.2*   GFR: Estimated Creatinine Clearance: 21.3 mL/min (A) (by C-G formula based on SCr of 2.28 mg/dL (H)). Recent Labs  Lab 12/25/23 0755 12/25/23 1020 12/25/23 1059 12/25/23 1628 12/26/23 0335  WBC 7.8 14.0*  --   --  17.2*  LATICACIDVEN  --   --  2.1* 2.2*  --     Liver Function Tests: No results for input(s): AST, ALT, ALKPHOS, BILITOT, PROT, ALBUMIN in the last 168 hours. No results for input(s): LIPASE, AMYLASE in the last 168 hours. No results for input(s): AMMONIA in the last 168 hours.  ABG    Component Value Date/Time   PHART 7.411 12/25/2023 1102   PCO2ART 38.3 12/25/2023 1102   PO2ART 101 12/25/2023 1102   HCO3 24.3 12/25/2023 1102   TCO2 25 12/25/2023 1102   O2SAT 98 12/25/2023 1102     Coagulation Profile: No results for input(s): INR, PROTIME in the last 168 hours.  Cardiac Enzymes: No results for input(s): CKTOTAL, CKMB, CKMBINDEX, TROPONINI in the last 168 hours.  HbA1C: Hgb A1c MFr Bld  Date/Time Value Ref Range Status  12/25/2023 04:28 PM 5.2 4.8 - 5.6 % Final    Comment:     (NOTE) Diagnosis of Diabetes The following HbA1c ranges recommended by the American Diabetes Association (ADA) may be used as an aid in the diagnosis of diabetes mellitus.  Hemoglobin             Suggested A1C NGSP%              Diagnosis  <5.7                   Non Diabetic  5.7-6.4                Pre-Diabetic  >6.4                   Diabetic  <7.0  Glycemic control for                       adults with diabetes.    01/15/2019 03:20 PM 5.0 4.8 - 5.6 % Final    Comment:    (NOTE) Pre diabetes:          5.7%-6.4% Diabetes:              >6.4% Glycemic control for   <7.0% adults with diabetes     CBG: Recent Labs  Lab 12/25/23 1526 12/25/23 1944 12/25/23 2322 12/26/23 0330 12/26/23 0802  GLUCAP 90 98 107* 109* 85    Review of Systems:   Unable to assess   Past Medical History:  She,  has a past medical history of ASD (atrial septal defect), Chronic systolic CHF (congestive heart failure) (HCC), CKD (chronic kidney disease), stage III (HCC), DM (diabetes mellitus) (HCC), Dyslipidemia, Essential hypertension, Hypothyroid, ICD (implantable cardioverter-defibrillator) in place, Mitral regurgitation, NICM (nonischemic cardiomyopathy) (HCC), NSVT (nonsustained ventricular tachycardia) (HCC), Obesity, OSA (obstructive sleep apnea), and S/P mitral valve clip implantation.   Surgical History:   Past Surgical History:  Procedure Laterality Date   ICD IMPLANT     MitraClip       Social History:   reports that she has never smoked. She has never used smokeless tobacco.   Family History:  Her Family history is unknown by patient.   Allergies No Known Allergies   Home Medications  Prior to Admission medications   Medication Sig Start Date End Date Taking? Authorizing Provider  amiodarone  (PACERONE ) 200 MG tablet Take 200 mg by mouth daily. 12/18/18   [provider]  aspirin  EC 81 MG tablet Take 81 mg by mouth daily.    [provider]  atorvastatin (LIPITOR) 40 MG tablet Take 40 mg by mouth daily. 12/13/18   [provider]  clopidogrel  (PLAVIX ) 75 MG tablet Take 75 mg by mouth daily. 12/07/18   [provider]  hydrALAZINE  (APRESOLINE ) 25 MG tablet Take 25 mg by mouth 3 (three) times daily.  01/04/19   [provider]  Insulin  Detemir (LEVEMIR FLEXTOUCH) 100 UNIT/ML Pen Inject 15 Units into the skin at bedtime.    [provider]  iron polysaccharides (NIFEREX) 150 MG capsule Take 150 mg by mouth every Monday, Wednesday, and Friday.    [provider]  isosorbide dinitrate (ISORDIL) 20 MG tablet Take 20 mg by mouth 3 (three) times daily. 12/22/18   [provider]  levothyroxine  (SYNTHROID ) 100 MCG tablet Take 100 mcg by mouth daily before breakfast. 12/22/18   [provider]  liraglutide (VICTOZA) 18 MG/3ML SOPN Inject 1.8 mg into the skin daily.    [provider]  magnesium oxide (MAG-OX) 400 MG tablet Take 400 mg by mouth 2 (two) times daily.  12/08/18   [provider]  milrinone  (PRIMACOR ) 20 MG/100 ML SOLN infusion Inject 0.0207 mg/min into the vein continuous. 01/20/19 02/19/19  Arrien, Mauricio Daniel, MD  sitaGLIPtin (JANUVIA) 50 MG tablet Take 50 mg by mouth daily.    [provider]  torsemide  (DEMADEX ) 20 MG tablet Take 20 mg by mouth daily. 12/18/18   [provider]     Critical care time:   CRITICAL CARE N/a  Donnice JONELLE Beals, MD [facility] Personal contact information can be found on Amion  If no contact or response made please call [phone] 12/26/2023, 10:32 AM

## 2023-12-26 NOTE — Progress Notes (Signed)
 Heart Failure Navigator Progress Note  Assessed for Heart & Vascular TOC clinic readiness.  Patient does not meet criteria due to see's [facility]. No HF TOC. .   Navigator will sign off at this time.   Randie Bustle, BSN, Scientist, clinical (histocompatibility and immunogenetics) Only

## 2023-12-26 NOTE — TOC Initial Note (Signed)
 Transition of Care [facility]) - Initial/Assessment Note    Patient Details  Name: Veronica Burke MRN: [id number] Date of Birth: 02/20/1956  Transition of Care [facility] CM/SW Contact:    Veronica KANDICE Stain, RN Phone Number: 12/26/2023, 2:13 PM  Clinical Narrative:                  Spoke to patient regarding transition needs.  Patient lives with spouse(who is blind) and daughter.  Patient is currently doing OP PT [facility] and wound like to continue there.  Patient drives herself to apts but her daughter can assist as needed.  Address, Phone number and PCP verified. [facility] [address] [facility].  Patient has CPAP from [facility] and if needs home 02 would like to use [facility].  TOC will continue to follow for needs.  Expected Discharge Plan: Home w Home Health Services Barriers to Discharge: Continued Medical Work up   Patient Goals and CMS Choice Patient states their goals for this hospitalization and ongoing recovery are:: Return home          Expected Discharge Plan and Services       Living arrangements for the past 2 months: Single Family Home                                      Prior Living Arrangements/Services Living arrangements for the past 2 months: Single Family Home Lives with:: Spouse, Adult Children Patient language and need for interpreter reviewed:: Yes Do you feel safe going back to the place where you live?: Yes      Need for Family Participation in Patient Care: Yes (Comment) Care giver support system in place?: Yes (comment)   Criminal Activity/Legal Involvement Pertinent to Current Situation/Hospitalization: No - Comment as needed  Activities of Daily Living   ADL Screening (condition at time of admission) Independently performs ADLs?: Yes (appropriate for developmental age) Is the patient deaf or have difficulty hearing?: No Does the patient have difficulty seeing, even when wearing glasses/contacts?: No Does the  patient have difficulty concentrating, remembering, or making decisions?: No  Permission Sought/Granted                  Emotional Assessment Appearance:: Appears stated age Attitude/Demeanor/Rapport: Engaged Affect (typically observed): Accepting Orientation: : Oriented to Self, Oriented to Place, Oriented to  Time, Oriented to Situation Alcohol / Substance Use: Not Applicable Psych Involvement: No (comment)  Admission diagnosis:  Acute respiratory failure (HCC) [J96.00] Hypoxia [R09.02] Congestive heart failure, unspecified HF chronicity, unspecified heart failure type (HCC) [I50.9] Patient Active Problem List   Diagnosis Date Noted   Acute respiratory failure (HCC) 12/25/2023   Acute on chronic respiratory failure with hypoxia (HCC) 01/19/2019   Type 2 diabetes mellitus with stage 3 chronic kidney disease (HCC) 01/19/2019   CKD stage 3 due to type 2 diabetes mellitus (HCC) 01/19/2019   Hypothyroid 01/19/2019   Obesity 01/19/2019   Acute on chronic HFrEF (heart failure with reduced ejection fraction) (HCC) 01/15/2019   PCP:  Veronica Tully HERO, PA-C Pharmacy:   [id number] [place] [place] - [address] [place] [address] Phone: [phone] Fax: [phone]  [facility] [address] [place] [address] Phone: [phone] Fax: [phone]     Social Drivers of Health (SDOH) Social History: SDOH Screenings  Food Insecurity: No Food Insecurity (12/25/2023)  Housing: Low Risk  (12/25/2023)  Transportation Needs: No Transportation Needs (12/25/2023)  Utilities: Not At Risk (12/25/2023)  Financial Resource Strain: Medium Risk (10/03/2022)   Received from [facility]  Social Connections: Moderately Isolated (12/25/2023)  Tobacco Use: Low Risk  (12/25/2023)   SDOH Interventions:     Readmission Risk Interventions     No data to display

## 2023-12-26 NOTE — Plan of Care (Signed)

## 2023-12-26 NOTE — TOC CM/SW Note (Signed)
 Transition of Care [facility]) - Inpatient Brief Assessment   Patient Details  Name: Veronica Burke MRN: [id number] Date of Birth: 1955/09/02  [facility]) CM/SW Contact:    Lauraine FORBES Saa, LCSW Phone Number: 12/26/2023, 9:18 AM   Clinical Narrative:  9:18 AM Per chart review, patient resides at home with spouse and child(ren). Patient has insurance but does not have a PCP. Patient does not have SNF/HH/DME history. [facility] [id number] [place]. TOC will continue to follow and be available to assist.   Transition of Care Asessment: Insurance and Status: Insurance coverage has been reviewed Patient has primary care physician: No Home environment has been reviewed: Private Residence Prior level of function:: N/A Prior/Current Home Services: No current home services Social Drivers of Health Review: SDOH reviewed no interventions necessary Readmission risk has been reviewed: Yes Transition of care needs: transition of care needs identified, TOC will continue to follow

## 2023-12-27 ENCOUNTER — Inpatient Hospital Stay (HOSPITAL_COMMUNITY)

## 2023-12-27 DIAGNOSIS — J9601 Acute respiratory failure with hypoxia: Secondary | ICD-10-CM

## 2023-12-27 LAB — GLUCOSE, CAPILLARY
Glucose-Capillary: 116 mg/dL — ABNORMAL HIGH (ref 70–99)
Glucose-Capillary: 107 mg/dL — ABNORMAL HIGH (ref 70–99)
Glucose-Capillary: 87 mg/dL (ref 70–99)
Glucose-Capillary: 98 mg/dL (ref 70–99)
Glucose-Capillary: 138 mg/dL — ABNORMAL HIGH (ref 70–99)

## 2023-12-27 LAB — CBC WITH DIFFERENTIAL/PLATELET
Abs Immature Granulocytes: 0.03 10*3/uL (ref 0.00–0.07)
Basophils Absolute: 0 10*3/uL (ref 0.0–0.1)
Basophils Relative: 0 %
Eosinophils Absolute: 0 10*3/uL (ref 0.0–0.5)
Eosinophils Relative: 0 %
HCT: 48.3 % — ABNORMAL HIGH (ref 36.0–46.0)
Hemoglobin: 15 g/dL (ref 12.0–15.0)
Immature Granulocytes: 0 %
Lymphocytes Relative: 11 %
Lymphs Abs: 1.1 10*3/uL (ref 0.7–4.0)
MCH: 28.3 pg (ref 26.0–34.0)
MCHC: 31.1 g/dL (ref 30.0–36.0)
MCV: 91.1 fL (ref 80.0–100.0)
Monocytes Absolute: 0.6 10*3/uL (ref 0.1–1.0)
Monocytes Relative: 6 %
Neutro Abs: 8 10*3/uL — ABNORMAL HIGH (ref 1.7–7.7)
Neutrophils Relative %: 83 %
Platelets: 159 10*3/uL (ref 150–400)
RBC: 5.3 MIL/uL — ABNORMAL HIGH (ref 3.87–5.11)
RDW: 14.8 % (ref 11.5–15.5)
WBC: 9.7 10*3/uL (ref 4.0–10.5)
nRBC: 0 % (ref 0.0–0.2)

## 2023-12-27 LAB — COMPREHENSIVE METABOLIC PANEL WITH GFR
ALT: 41 U/L (ref 0–44)
AST: 20 U/L (ref 15–41)
Albumin: 2.8 g/dL — ABNORMAL LOW (ref 3.5–5.0)
Alkaline Phosphatase: 69 U/L (ref 38–126)
Anion gap: 11 (ref 5–15)
BUN: 41 mg/dL — ABNORMAL HIGH (ref 8–23)
CO2: 32 mmol/L (ref 22–32)
Calcium: 9 mg/dL (ref 8.9–10.3)
Chloride: 101 mmol/L (ref 98–111)
Creatinine, Ser: 2.51 mg/dL — ABNORMAL HIGH (ref 0.44–1.00)
GFR, Estimated: 20 mL/min — ABNORMAL LOW (ref >60)
Glucose, Bld: 172 mg/dL — ABNORMAL HIGH (ref 70–99)
Potassium: 3.9 mmol/L (ref 3.5–5.1)
Sodium: 144 mmol/L (ref 135–145)
Total Bilirubin: 1.2 mg/dL (ref 0.0–1.2)
Total Protein: 6 g/dL — ABNORMAL LOW (ref 6.5–8.1)

## 2023-12-27 MED ORDER — AMIODARONE HCL 200 MG PO TABS
200.0000 mg | ORAL_TABLET | Freq: Every day | ORAL | Status: DC
  Administered 2023-12-27 – 2023-12-28 (×2): 200 mg via ORAL
  Filled 2023-12-27 (×2): qty 1

## 2023-12-27 MED ORDER — EMPAGLIFLOZIN 10 MG PO TABS
10.0000 mg | ORAL_TABLET | Freq: Every day | ORAL | Status: DC
  Administered 2023-12-27 – 2023-12-28 (×2): 10 mg via ORAL
  Filled 2023-12-27 (×2): qty 1

## 2023-12-27 MED ORDER — SODIUM CHLORIDE 0.9 % IV SOLN: INTRAVENOUS | Status: AC

## 2023-12-27 MED ORDER — ORAL CARE MOUTH RINSE: 15.0000 mL | OROMUCOSAL | Status: DC | PRN

## 2023-12-27 MED ORDER — DOXYCYCLINE HYCLATE 100 MG PO TABS
100.0000 mg | ORAL_TABLET | Freq: Two times a day (BID) | ORAL | Status: DC
  Administered 2023-12-27 – 2023-12-28 (×3): 100 mg via ORAL
  Filled 2023-12-27 (×3): qty 1

## 2023-12-27 MED ORDER — SODIUM CHLORIDE 0.9 % IV SOLN
2.0000 g | INTRAVENOUS | Status: DC
  Administered 2023-12-27 – 2023-12-28 (×2): 2 g via INTRAVENOUS
  Filled 2023-12-27 (×2): qty 20

## 2023-12-27 MED ORDER — MIDODRINE HCL 5 MG PO TABS
5.0000 mg | ORAL_TABLET | Freq: Three times a day (TID) | ORAL | Status: DC
  Administered 2023-12-27 – 2023-12-28 (×4): 5 mg via ORAL
  Filled 2023-12-27 (×4): qty 1

## 2023-12-27 NOTE — Evaluation (Addendum)
 Occupational Therapy Evaluation/Discharge Patient Details Name: Veronica Burke MRN: [id number] DOB: [birth year] Today's Date: 12/27/2023   History of Present Illness   Pt presents to [facility] on 12/25/23 w/ SOB in setting of acute exacerbation CHF w/ volume overload requiring BiPAP. PMH: nonischemic cardiomyopathy w/ ICD in place, T2DM, HTN, HLD, hypothyroidism, OSA mitral valve regurgitation s/p mitral clip.     Clinical Impressions PTA, pt lives with family and typically completely Independent in all ADLs, IADLs and mobility without AD. Pt presents now with minor deficits in cardiopulmonary endurance. Pt able to mobilize in hallway without AD and without LOB, as well as manage ADLs with Modified Independence. Able to titrate to 1 L O2 but desats to 87% noted when attempted on RA. Anticipate good progress with titration to RA. Provided handouts for energy conservation and CHF&Self Care with pt actively engaged in education. Pt denies concerns managing at home; recommend continued OOB activity with mobility specialists while admitted. No further OT services needed at this time.     If plan is discharge home, recommend the following:   Other (comment) (PRN)     Functional Status Assessment   Patient has not had a recent decline in their functional status     Equipment Recommendations   None recommended by OT     Recommendations for Other Services         Precautions/Restrictions   Precautions Precautions: Fall Recall of Precautions/Restrictions: Intact Precaution/Restrictions Comments: monitor O2 Restrictions Weight Bearing Restrictions Per Provider Order: No     Mobility Bed Mobility               General bed mobility comments: in recliner    Transfers Overall transfer level: Independent Equipment used: None                      Balance Overall balance assessment: No apparent balance deficits (not formally assessed)                                          ADL either performed or assessed with clinical judgement   ADL Overall ADL's : Modified independent                                       General ADL Comments: received in bathroom with nursing present. pt able to manage remainder of toileting task without assist, mobilize in hallway without AD and actively engaged in energy conservation education. Handouts provided for energy conservation and CHF mgmt/Self Care tasks. Pt denies concerns regarding ADL/IADL mgmt at home, discussed locating pulse ox at home to check O2 levels upon DC.     Vision Baseline Vision/History: 1 Wears glasses Ability to See in Adequate Light: 0 Adequate Patient Visual Report: No change from baseline Vision Assessment?: No apparent visual deficits     Perception         Praxis         Pertinent Vitals/Pain Pain Assessment Pain Assessment: No/denies pain     Extremity/Trunk Assessment Upper Extremity Assessment Upper Extremity Assessment: Overall WFL for tasks assessed;Right hand dominant   Lower Extremity Assessment Lower Extremity Assessment: Defer to PT evaluation   Cervical / Trunk Assessment Cervical / Trunk Assessment: Normal   Communication Communication Communication: No apparent difficulties   Cognition Arousal: Alert  Behavior During Therapy: WFL for tasks assessed/performed Cognition: No apparent impairments                               Following commands: Intact       Cueing  General Comments   Cueing Techniques: Verbal cues;Visual cues  RN present, son and daughter in law and daughter at end of session. Variable O2 readings, on RA in bathroom with SpO2 initially 82%. with 1 L O2 and pursed lip breathing, increased to 92%. on 1 L O2, SpO2 95%, trial on RA with initial reading of 87% improving to 93% on 1 L O2 with seated rest break. Within 2 min, SpO2 99% on 1 L O2.   Exercises     Shoulder Instructions      Home  Living Family/patient expects to be discharged to:: Private residence Living Arrangements: Spouse/significant other;Children Available Help at Discharge: Family;Available 24 hours/day (spouse is legally blind) Type of Home: House Home Access: Stairs to enter [employer] of Steps: 1 Entrance Stairs-Rails: None Home Layout: One level     Bathroom Shower/Tub: Walk-in shower;Tub/shower unit   Bathroom Toilet: Standard     Home Equipment: Rollator (4 wheels);Rolling Walker (2 wheels);Cane - single point          Prior Functioning/Environment Prior Level of Function : Independent/Modified Independent;Driving             Mobility Comments: independent w/o AD ADLs Comments: independent with ADLs, IADLs - reports cleaning requires rest breaks and takes awhile but able to manage. drives but limited by some R LE  muscle discomfort    OT Problem List: Cardiopulmonary status limiting activity   OT Treatment/Interventions:        OT Goals(Current goals can be found in the care plan section)   Acute Rehab OT Goals Patient Stated Goal: wean off of O2 OT Goal Formulation: All assessment and education complete, DC therapy   OT Frequency:       Co-evaluation              AM-PAC OT 6 Clicks Daily Activity     Outcome Measure Help from another person eating meals?: None Help from another person taking care of personal grooming?: None Help from another person toileting, which includes using toliet, bedpan, or urinal?: None Help from another person bathing (including washing, rinsing, drying)?: None Help from another person to put on and taking off regular upper body clothing?: None Help from another person to put on and taking off regular lower body clothing?: None 6 Click Score: 24   End of Session Equipment Utilized During Treatment: Oxygen Nurse Communication: Mobility status;Other (comment) (O2)  Activity Tolerance: Patient tolerated treatment  well Patient left: in chair;with call bell/phone within reach;with family/visitor present  OT Visit Diagnosis: Other (comment) (decreased cardiopulmonary endurance)                Time: 8693-8669 OT Time Calculation (min): 24 min Charges:  OT General Charges $OT Visit: 1 Visit OT Evaluation $OT Eval Low Complexity: 1 Low  Mliss NOVAK, OTR/L Acute Rehab Services Office: [phone]   Mliss Fish 12/27/2023, 1:43 PM

## 2023-12-27 NOTE — Evaluation (Signed)
 Physical Therapy Evaluation Patient Details Name: Veronica Burke MRN: [id number] DOB: [birth year] Today's Date: 12/27/2023  History of Present Illness  Pt presents to [facility] on 12/25/23 w/ acute onset of SOB that appears to be in acute exacerbation CHF w/ volume overload remains BiPAP dependent. PMH is significant for nonischemic cardiomyopathy w/ ICD in place, T2DM, HTN, HLD, hypothyroidism, OSA mitral valve regurgitation s/p mitral clip.   Clinical Impression  Prior to admittance pt was mobilizing independently without an AD and was independent with all ADLs. Pt presents to evaluation with deficits in strength and activity tolerance. Pt was mod I for all mobility, and was able to wean from 5L to 2L while mobilizing with oxygen reading 96%. Pt was able to ambulate household level distances w/out an AD and no physical assistance given. Pt reports confidence in ability to complete stair navigation and does not feel she needs follow-up therapies. PT will continue to treat pt while she is admitted. No follow-up therapy recommended.      If plan is discharge home, recommend the following: Help with stairs or ramp for entrance;Assist for transportation   Can travel by private vehicle        Equipment Recommendations None recommended by PT  Recommendations for Other Services       Functional Status Assessment Patient has had a recent decline in their functional status and demonstrates the ability to make significant improvements in function in a reasonable and predictable amount of time.     Precautions / Restrictions Precautions Precautions: Fall Recall of Precautions/Restrictions: Intact Restrictions Weight Bearing Restrictions Per Provider Order: No      Mobility  Bed Mobility Overal bed mobility: Modified Independent             General bed mobility comments: increased time to complete    Transfers Overall transfer level: Modified independent Equipment used: None                General transfer comment: increased time to complete    Ambulation/Gait Ambulation/Gait assistance: Modified independent (Device/Increase time) Gait Distance (Feet): 75 Feet Assistive device: None Gait Pattern/deviations: Step-through pattern, Decreased stride length Gait velocity: functional Gait velocity interpretation: <1.8 ft/sec, indicate of risk for recurrent falls   General Gait Details: pt ambulates w/ reciprocal gait pattern and reduced cadence. No notable losses of balance or instances of instability.  Stairs            Wheelchair Mobility     Tilt Bed    Modified Rankin (Stroke Patients Only)       Balance Overall balance assessment: Modified Independent                                           Pertinent Vitals/Pain Pain Assessment Pain Assessment: No/denies pain    Home Living Family/patient expects to be discharged to:: Private residence Living Arrangements: Spouse/significant other;Children Available Help at Discharge: Family;Available 24 hours/day (spouse is legally blind but pt reports she is able to talk him through tasks) Type of Home: House Home Access: Stairs to enter Entrance Stairs-Rails: None Entrance Stairs-Number of Steps: 1   Home Layout: One level Home Equipment: Rollator (4 wheels);Rolling Walker (2 wheels);Cane - single point      Prior Function Prior Level of Function : Independent/Modified Independent             Mobility Comments: independent w/out an AD  ADLs Comments: independent     Extremity/Trunk Assessment   Upper Extremity Assessment Upper Extremity Assessment: Overall WFL for tasks assessed    Lower Extremity Assessment Lower Extremity Assessment: Generalized weakness    Cervical / Trunk Assessment Cervical / Trunk Assessment: Kyphotic  Communication   Communication Communication: No apparent difficulties    Cognition Arousal: Alert Behavior During Therapy: WFL for tasks  assessed/performed   PT - Cognitive impairments: No apparent impairments                         Following commands: Intact       Cueing Cueing Techniques: Verbal cues, Visual cues     General Comments General comments (skin integrity, edema, etc.): titrated pt's oxygen while mobilizing from 5L>4L>3L>2L w/ pulse ox reading >96 throughout session.    Exercises     Assessment/Plan    PT Assessment Patient needs continued PT services  PT Problem List Decreased activity tolerance;Decreased strength;Decreased mobility;Cardiopulmonary status limiting activity       PT Treatment Interventions Gait training;Functional mobility training;Patient/family education;Therapeutic exercise;Balance training;Stair training;Therapeutic activities    PT Goals (Current goals can be found in the Care Plan section)  Acute Rehab PT Goals Patient Stated Goal: to go home PT Goal Formulation: With patient Time For Goal Achievement: 01/10/24 Potential to Achieve Goals: Good    Frequency Min 1X/week     Co-evaluation               AM-PAC PT 6 Clicks Mobility  Outcome Measure Help needed turning from your back to your side while in a flat bed without using bedrails?: None Help needed moving from lying on your back to sitting on the side of a flat bed without using bedrails?: None Help needed moving to and from a bed to a chair (including a wheelchair)?: None Help needed standing up from a chair using your arms (e.g., wheelchair or bedside chair)?: None Help needed to walk in hospital room?: None Help needed climbing 3-5 steps with a railing? : A Little 6 Click Score: 23    End of Session Equipment Utilized During Treatment: Gait belt;Oxygen (2L) Activity Tolerance: Patient tolerated treatment well Patient left: in chair;with call bell/phone within reach;with chair alarm set Nurse Communication: Mobility status;Other (comment) (oxygen) PT Visit Diagnosis: Muscle weakness  (generalized) (M62.81)    Time: 8981-8948 PT Time Calculation (min) (ACUTE ONLY): 33 min   Charges:   PT Evaluation $PT Eval Low Complexity: 1 Low   PT General Charges $$ ACUTE PT VISIT: 1 Visit         Leontine Hilt, SPT Acute Rehab [phone]   Leontine Hilt 12/27/2023, 1:21 PM

## 2023-12-27 NOTE — Consult Note (Signed)
 Cardiology Consultation   Patient ID: Veronica Burke MRN: [id number]; DOB: 05/01/1956  Admit date: 12/25/2023 Date of Consult: 12/27/2023  PCP:  Sharl Tully HERO, PA-C   [facility] Providers Cardiologist:  None         History of Present Illness: Veronica Burke 68 year old with chronic systolic heart failure EF <20% on echocardiogram 12/25/2023. MitraClip however severe central jet noted. AV nodal ablation 2024 OSA with hypoglossal nerve stimulator Hypotensive  Admitted to ICU with respiratory distress on BiPAP.  Lasix  administered 80 mg IV twice daily.  She states that she is feeling much better currently after diuresis.  She is sitting up in her chair eating her spaghetti dinner.  She states that she was unable to complete a full sentence when she came into the hospital from her breathing difficulties.  Denies any chest pain.     Past Medical History:  Diagnosis Date   ASD (atrial septal defect)    a. small ASD by echo 01/2019.   Chronic systolic CHF (congestive heart failure) (HCC)    a. on home milrinone .   CKD (chronic kidney disease), stage III (HCC)    DM (diabetes mellitus) (HCC)    Dyslipidemia    Essential hypertension    Hypothyroid    ICD (implantable cardioverter-defibrillator) in place    Mitral regurgitation    a. s/p Mitraclip 12/2018.   NICM (nonischemic cardiomyopathy) (HCC)    NSVT (nonsustained ventricular tachycardia) (HCC)    Obesity    OSA (obstructive sleep apnea)    on CPAP   S/P mitral valve clip implantation     Past Surgical History:  Procedure Laterality Date   ICD IMPLANT     MitraClip       Home Medications:  Prior to Admission medications   Medication Sig Start Date End Date Taking? Authorizing Provider  allopurinol (ZYLOPRIM) 100 MG tablet Take 50 mg by mouth every other day. 04/05/22  Yes [provider]  amiodarone  (PACERONE ) 200 MG tablet Take 200 mg by mouth daily. 12/18/18  Yes [provider]   atorvastatin (LIPITOR) 40 MG tablet Take 40 mg by mouth daily. 12/13/18  Yes [provider]  Cholecalciferol (VITAMIN D3 MAXIMUM STRENGTH) 125 MCG (5000 UT) capsule Take 5,000 Units by mouth daily.   Yes [provider]  digoxin (LANOXIN) 0.125 MG tablet Take 0.0625 mg by mouth every Monday, Wednesday, and Friday.   Yes [provider]  Dulaglutide (TRULICITY) 1.5 MG/0.5ML SOAJ Inject 1.5 mg into the skin once a week. Thursday 04/14/22  Yes [provider]  ELIQUIS 5 MG TABS tablet Take 5 mg by mouth 2 (two) times daily.   Yes [provider]  empagliflozin (JARDIANCE) 10 MG TABS tablet Take 10 mg by mouth daily. 05/06/22  Yes [provider]  iron polysaccharides (NIFEREX) 150 MG capsule Take 150 mg by mouth once a week. Monday   Yes [provider]  magnesium oxide (MAG-OX) 400 MG tablet Take 400 mg by mouth 2 (two) times daily.  12/08/18  Yes [provider]  metoprolol succinate (TOPROL-XL) 25 MG 24 hr tablet Take 25 mg by mouth daily.   Yes [provider]  torsemide  (DEMADEX ) 10 MG tablet Take 10 mg by mouth every other day. 12/18/18  Yes [provider]    Scheduled Meds:  amiodarone   200 mg Oral Daily   apixaban  5 mg Oral BID   atorvastatin  40 mg Oral Daily   Chlorhexidine  Gluconate Cloth  6 each Topical Daily   doxycycline  100 mg Oral Q12H   empagliflozin  10 mg Oral Daily   insulin  aspart  0-9 Units Subcutaneous TID WC   midodrine  5 mg Oral TID WC   mouth rinse  15 mL Mouth Rinse 4 times per day   Continuous Infusions:  sodium chloride  40 mL/hr at 12/27/23 1518   cefTRIAXone (ROCEPHIN)  IV 2 g (12/27/23 1528)   PRN Meds: docusate sodium, mouth rinse, polyethylene glycol  Allergies:    Allergies  Allergen Reactions   Beef-Derived Drug Products Other (See Comments)    Dislikes beef    Social History:   Social History   Socioeconomic History   Marital status: Married    Spouse  name: Not on file   Number of children: Not on file   Years of education: Not on file   Highest education level: Not on file  Occupational History   Not on file  Tobacco Use   Smoking status: Never   Smokeless tobacco: Never  Substance and Sexual Activity   Alcohol use: Not on file   Drug use: Not on file   Sexual activity: Not on file  Other Topics Concern   Not on file  Social History Narrative   Not on file   Social Drivers of Health   Financial Resource Strain: Medium Risk (10/03/2022)   Received from [facility] [employer] Strain (CARDIA)    Difficulty of Paying Living Expenses: Somewhat hard  Food Insecurity: No Food Insecurity (12/25/2023)   Hunger Vital Sign    Worried About Running Out of Food in the Last Year: Never true    Ran Out of Food in the Last Year: Never true  Transportation Needs: No Transportation Needs (12/25/2023)   PRAPARE - Administrator, Civil Service (Medical): No    Lack of Transportation (Non-Medical): No  Physical Activity: Not on file  Stress: Not on file  Social Connections: Moderately Isolated (12/25/2023)   Social Connection and Isolation Panel    Frequency of Communication with Friends and Family: Once a week    Frequency of Social Gatherings with Friends and Family: Once a week    Attends Religious Services: More than 4 times per year    Active Member of [employer] or Organizations: No    Attends Banker Meetings: Never    Marital Status: Married  Catering manager Violence: Not At Risk (12/25/2023)   Humiliation, Afraid, Rape, and Kick questionnaire    Fear of Current or Ex-Partner: No    Emotionally Abused: No    Physically Abused: No    Sexually Abused: No    Family History:    Family History  Family history unknown: Yes     ROS:  Please see the history of present illness.   All other ROS reviewed and negative.     Physical Exam/Data: Vitals:   12/27/23 0837 12/27/23  1124 12/27/23 1319 12/27/23 1609  BP:  (!) 98/57    Pulse: 65 61  63  Resp: 16 18  15   Temp:  97.9 F (36.6 C)    TempSrc:  Oral    SpO2: 96% 97% 94% 98%  Weight:      Height:        Intake/Output Summary (Last 24 hours) at 12/27/2023 1723 Last data filed at 12/27/2023 1300 Gross per 24 hour  Intake 240 ml  Output 1200 ml  Net -960 ml  12/27/2023    5:29 AM 12/26/2023    7:01 AM 12/25/2023    3:30 PM  Last 3 Weights  Weight (lbs) 167 lb 12.3 oz 171 lb 15.3 oz 172 lb 9.9 oz  Weight (kg) 76.1 kg 78 kg 78.3 kg     Body mass index is 33.32 kg/m.  General:  Well nourished, well developed, in no acute distress black female HEENT: normal Neck: no JVD Vascular: No carotid bruits; Distal pulses 2+ bilaterally Cardiac:  normal S1, S2; RRR; soft systolic murmur  Lungs:  clear to auscultation bilaterally, no wheezing, rhonchi or rales  Abd: soft, nontender, no hepatomegaly  Ext: Minimal lower extremity edema Musculoskeletal:  No deformities, BUE and BLE strength normal and equal Skin: warm and dry  Neuro:  CNs 2-12 intact, no focal abnormalities noted Psych:  Normal affect   EKG:  The EKG was personally reviewed and demonstrates: Ventricular pacing with artifact likely from hypoglossal nerve stimulator. Telemetry:  Telemetry was personally reviewed and demonstrates: No adverse arrhythmias, V paced  Relevant CV Studies: Echo EF less than 20 percent, MitraClip in place however severe central MR.  Laboratory Data: High Sensitivity Troponin:   Recent Labs  Lab 12/25/23 0755 12/25/23 0900  TROPONINIHS 23* 22*     Chemistry Recent Labs  Lab 12/26/23 0335 12/26/23 1648 12/27/23 0826  NA 146* 146* 144  K 4.1 4.4 3.9  CL 112* 103 101  CO2 24 22 32  GLUCOSE 103* 97 172*  BUN 39* 40* 41*  CREATININE 2.28* 2.32* 2.51*  CALCIUM 8.7* 9.1 9.0  MG 2.2  --   --   GFRNONAA 23* 22* 20*  ANIONGAP 10 21* 11    Recent Labs  Lab 12/27/23 0826  PROT 6.0*  ALBUMIN 2.8*  AST  20  ALT 41  ALKPHOS 69  BILITOT 1.2   Lipids No results for input(s): CHOL, TRIG, HDL, LABVLDL, LDLCALC, CHOLHDL in the last 168 hours.  Hematology Recent Labs  Lab 12/25/23 1020 12/25/23 1102 12/26/23 0335 12/27/23 0826  WBC 14.0*  --  17.2* 9.7  RBC 5.79*  --  5.66* 5.30*  HGB 16.5* 18.0* 15.7* 15.0  HCT 53.6* 53.0* 52.1* 48.3*  MCV 92.6  --  92.0 91.1  MCH 28.5  --  27.7 28.3  MCHC 30.8  --  30.1 31.1  RDW 15.0  --  14.8 14.8  PLT 161  --  135* 159   Thyroid No results for input(s): TSH, FREET4 in the last 168 hours.  BNP Recent Labs  Lab 12/25/23 0755  BNP 2,406.0*    DDimer No results for input(s): DDIMER in the last 168 hours.  Radiology/Studies:  DG CHEST PORT 1 VIEW Result Date: 12/27/2023 CLINICAL DATA:  History of pneumonia EXAM: PORTABLE CHEST 1 VIEW COMPARISON:  Chest x-ray 12/25/2023 FINDINGS: Cardiomegaly again noted. Left-sided ICD and right-sided generator are again noted. The lungs are clear. There is no pleural effusion or pneumothorax. No acute fractures are seen. IMPRESSION: 1. No active disease. 2. Cardiomegaly. Electronically Signed   By: Greig Pique M.D.   On: 12/27/2023 15:28   ECHOCARDIOGRAM COMPLETE Result Date: 12/25/2023    ECHOCARDIOGRAM REPORT   Patient Name:   Veronica Burke Date of Exam: 12/25/2023 Medical Rec #:  [id number]   Height:       59.5 in Accession #:    [id number]  Weight:       173.0 lb Date of Birth:  09-05-1955  BSA:  1.744 m Patient Age:    67 years    BP:           134/84 mmHg Patient Gender: F           HR:           115 bpm. Exam Location:  Inpatient Procedure: 2D Echo, Cardiac Doppler and Color Doppler (Both Spectral and Color            Flow Doppler were utilized during procedure). Indications:    CHF - Acute Systolic I50.21  History:        Patient has no prior history of Echocardiogram examinations.                 CHF, CKD, stage 3; Risk Factors:Diabetes.  Sonographer:    Thea Norlander RCS Referring  Phys: BENTON BIRCH HARRIS IMPRESSIONS  1. Left ventricular ejection fraction, by estimation, is <20%. The left ventricle has severely decreased function. The left ventricle demonstrates global hypokinesis. The left ventricular internal cavity size was severely dilated. Left ventricular diastolic function could not be evaluated due to mitral annular calcification (moderate or greater) and possible repair. But findings suggestive of Grade III diastolic dysfunction (restrictive).. Elevated left atrial pressure. The E/e' is 29.  2. Right ventricular systolic function is low normal. The right ventricular size is normal.  3. Left atrial size was grossly normal.  4. Right atrial size was grossly normal.  5. Mitral valve degenerative and possibly repaired (s/p Mitral Clip at A2/P3 scallop best seen on apical views), severe MR (jet directed central and eccentric to the lateral wall), no valvular stenosis. . Severe mitral annular calcification.  6. The aortic valve is tricuspid. Aortic valve regurgitation is moderate. No aortic stenosis is present.  7. The inferior vena cava is normal in size with greater than 50% respiratory variability, suggesting right atrial pressure of 3 mmHg. Comparison(s): No prior Echocardiogram. FINDINGS  Left Ventricle: Left ventricular ejection fraction, by estimation, is <20%. The left ventricle has severely decreased function. The left ventricle demonstrates global hypokinesis. The left ventricular internal cavity size was severely dilated. There is no left ventricular hypertrophy. Left ventricular diastolic function could not be evaluated due to mitral annular calcification (moderate or greater) and possible repair. But findings suggestive of Grade III diastolic dysfunction (restrictive). Elevated left atrial pressure. The E/e' is 71. Right Ventricle: The right ventricular size is normal. Right vetricular wall thickness was not well visualized. Right ventricular systolic function is low normal.  Left Atrium: Left atrial size was grossly normal. Right Atrium: Right atrial size was grossly normal. Pericardium: There is no evidence of pericardial effusion. Mitral Valve: Mitral valve degenerative and possibly repaired (s/p Mitral Clip at A2/P3 scallop best seen on apical views), severe MR (jet directed central and eccentric to the lateral wall), no valvular stenosis. Severe mitral annular calcification. Tricuspid Valve: The tricuspid valve is grossly normal. Tricuspid valve regurgitation is trivial. No evidence of tricuspid stenosis. Aortic Valve: The aortic valve is tricuspid. Aortic valve regurgitation is moderate. No aortic stenosis is present. Aortic valve peak gradient measures 8.2 mmHg. Pulmonic Valve: The pulmonic valve was not well visualized. Aorta: The aortic root and ascending aorta are structurally normal, with no evidence of dilitation. Venous: The inferior vena cava is normal in size with greater than 50% respiratory variability, suggesting right atrial pressure of 3 mmHg. IAS/Shunts: There is right bowing of the interatrial septum, suggestive of elevated left atrial pressure. The atrial septum is grossly normal. Additional Comments:  A device lead is visualized.  LEFT VENTRICLE PLAX 2D LVIDd:         6.40 cm      Diastology LVIDs:         6.00 cm      LV e' medial:    6.64 cm/s LV PW:         0.80 cm      LV E/e' medial:  23.9 LV IVS:        0.70 cm      LV e' lateral:   4.79 cm/s LVOT diam:     2.10 cm      LV E/e' lateral: 33.2 LV SV:         52 LV SV Index:   30 LVOT Area:     3.46 cm  LV Volumes (MOD) LV vol d, MOD A2C: 171.0 ml LV vol d, MOD A4C: 181.0 ml LV vol s, MOD A2C: 144.0 ml LV vol s, MOD A4C: 159.0 ml LV SV MOD A2C:     27.0 ml LV SV MOD A4C:     181.0 ml LV SV MOD BP:      26.9 ml RIGHT VENTRICLE         IVC TAPSE (M-mode): 2.0 cm  IVC diam: 1.80 cm LEFT ATRIUM         Index LA diam:    4.00 cm 2.29 cm/m  AORTIC VALVE AV Area (Vmax): 2.07 cm AV Vmax:        143.00 cm/s AV Peak  Grad:   8.2 mmHg LVOT Vmax:      85.60 cm/s LVOT Vmean:     56.000 cm/s LVOT VTI:       0.151 m  AORTA Ao Root diam: 3.10 cm Ao Asc diam:  3.60 cm MITRAL VALVE MV Area (PHT): 4.57 cm     SHUNTS MV Decel Time: 166 msec     Systemic VTI:  0.15 m MV E velocity: 159.00 cm/s  Systemic Diam: 2.10 cm MV A velocity: 46.40 cm/s MV E/A ratio:  3.43 Sunit Tolia Electronically signed by Madonna Large Signature Date/Time: 12/25/2023/1:38:43 PM    Final    DG Chest Portable 1 View Result Date: 12/25/2023 CLINICAL DATA:  Shortness of breath chest rate EXAM: PORTABLE CHEST 1 VIEW COMPARISON:  Graft dated 10/26/2023 FINDINGS: Lines/tubes: Left chest wall ICD leads project over the right atrium and ventricle and tributary of the coronary sinus. Nerve stimulator projects over the right chest. Lead terminates above the field of view. Lungs: Low lung volumes with bronchovascular crowding. Diffuse bilateral patchy and interstitial opacities. Pleura: The left costophrenic angle is obscured. No definite pneumothorax or pleural effusion. Heart/mediastinum: Similar enlarged cardiomediastinal silhouette. Bones: No acute osseous abnormality. IMPRESSION: 1. Diffuse bilateral patchy and interstitial opacities, which may represent pulmonary edema or multifocal infection. 2. Similar cardiomegaly. Electronically Signed   By: Limin  Xu M.D.   On: 12/25/2023 08:21     Assessment and Plan:  68 year old with chronic systolic heart failure EF 20 to 25% prior MitraClip 2020 here with acute exacerbation of systolic heart failure.  Acute on chronic systolic heart failure Biventricular Medtronic pacemaker-2022 Type 2 diabetes OSA with hypoglossal nerve stimulator MitraClip-severe mitral regurgitation subsequent -20% EF.  Challenging situation. -Creatinine last checked 2.5 very similar to baseline but slightly increased from admission. -Hemoglobin 15 - Ventricular pacing artifact noted on ECG -Continue with amiodarone  200 mg a day according  to last interrogation of her biventricular ICD implant by Dr. Meldon, EP  note reviewed from outside hospital, she had very short episodes of paroxysmal atrial fibrillation and he advised to continue with the amiodarone  200 mg a day, midodrine 5 mg 3 times a day.  Jardiance 10 mg a day -Blood pressure fairly low, 98/57 last checked.  Unable to push any further medications.   -She was previously at 1 point last month on Entresto 49/51 twice a day, torsemide  20 mg a day, metoprolol succinate 25 mg a day, digoxin 6.25 mcg 3 times a week  Continuing with 5 mg twice daily apixaban.  Her type 2 diabetes has been managed by Dr. Elizbeth Blanch at [facility], last seen on 12/14/2023  I think for now I would continue with the following at discharge Amiodarone  200 mg a day Midodrine 5 mg 3 times a day Jardiance 10 mg a day Eliquis 5 mg twice daily Atorvastatin 40 mg a day Torsemide  20 mg daily  We will hold off on Entresto, Toprol due to hypotension.       For questions or updates, please contact [facility] Please consult [email or web address] for contact info under    Signed, Oneil Parchment, MD  12/27/2023 5:23 PM

## 2023-12-27 NOTE — Progress Notes (Signed)
 [facility] ROUNDING NOTE Veronica Burke FMW:[id number]  DOB: 06-02-1956  DOA: 12/25/2023  PCP: Veronica Tully HERO, PA-C  12/27/2023,1:40 PM  LOS: 2 days    Code Status: FUll   From:  home   Current Dispo: home liekly    68 year old female Known EF 20-25% HFrEF known Bivent Medtronic PPM placed 2022 followed [facility]--Posat op complicaiton tamponade-- BiV pacing follows with Dr. Hildegard Burke Severe mitral regurg with MitraClip 12/06/2018 2025 Paroxysmal A-fib on amiodarone  apixaban 5 twice daily DM 2 monitor HTN OSA on CPAP with known hypoglossal nerve stimulator Class II obesity BMI 33  - Admit by critical care 6/22 secondary to SOB 86% room air needed BiPAP in the ED BNP 08/08/2004 troponin trend flat x-ray consistent with bilateral patchy interstitial opacities edema versus infection-sodium 143 potassium 3.9 BUN/creatinine 38/2.3 baseline around 2 BNP 08/08/2004 WBC 7.8--lactic acid slight elevation procalcitonin 3.5  treated for both heart failure and pneumonia and transferred to [facility] 6/24   Plan  Acute exacerbation of known HFrEF Echo assessment less than 20% grade 3 diastolic dysfunction elevated left atrial pressure with severe MR jet centrally directed and severe mitral valvular calcification In ICU diuresed with Lasix  80 every 6 until 6/23 but is now intermittently hypotensive Tells me he has been taking torsemide  every other day 10 mg She is not a candidate for GDMT because she is intermittently hypotensive will give IVF 40 cc for 12 hours and add midodrine 5 3 times daily --- care with extremely depressed EF I will ask cardiology team to see and comment on med rec's for eventual discharge last documented office visit 03/24/2023 was on Jardiance Entresto torsemide  digoxin and weight was 182 pounds--initially was overloaded currently very much below usual weight currently at 167  Nonrheumatic mitral valve regurg with MitraClip Echo shows severe central MR jet   paroxysmal A-fib biventricular Medtronic  PPM--atrial therapies turned off Recent AV node ablation 01/05/2023 Not on current amiodarone  200 daily [goal was to get her off of this per 03/2023 note] Would hold digoxin for now given renal insufficiency and risk for toxicity  Concern for pneumonia and not ruled out completely Get x-ray-given procalcitonin of 3.5 would start ceftriaxone 1 g every 12 and doxycycline 100 twice daily If x-ray is negative would discontinue antibiotics in a.m.--- would not trend procalcitonin Desat screen needed  CKD 3B follows with nephrology [facility] as an outpatient--baseline creatinine about 1.8 to 2.0 Now more dry than usual-saline 40 cc/h very c carefully and recheck labs in the morning  OSA  with hypoglossal nerve stimulator  Class II obesity BMI 33   DVT prophylaxis: Eliquis  Status is: Inpatient Remains inpatient appropriate because: For further workup and management      Subjective: Walking around the room does not feel particularly lightheaded--tells me that when she gains weight it is mainly in her abdomen Note that her blood pressure at home is usually in the 120 range She has no chest pain She is on oxygen currently  Objective + exam Vitals:   12/27/23 0835 12/27/23 0837 12/27/23 1124 12/27/23 1319  BP:   (!) 98/57   Pulse: 66 65 61   Resp: 15 16 18    Temp:   97.9 F (36.6 C)   TempSrc:   Oral   SpO2: 98% 96% 97% 94%  Weight:      Height:       Filed Weights   12/25/23 1530 12/26/23 0701 12/27/23 0529  Weight: 78.3 kg 78 kg 76.1 kg  Pleasant coherent awake alert ill-appearing chronically white female No JVD at baseline Dry mucosa S1-S2 no murmur Neck soft supple Abdomen soft no rebound no guarding does not feel more swollen there that she does ice mainly on her   Data Reviewed: reviewed   CBC    Component Value Date/Time   WBC 9.7 12/27/2023 0826   RBC 5.30 (H) 12/27/2023 0826   HGB 15.0 12/27/2023 0826   HCT 48.3 (H) 12/27/2023 0826   PLT 159 12/27/2023  0826   MCV 91.1 12/27/2023 0826   MCH 28.3 12/27/2023 0826   MCHC 31.1 12/27/2023 0826   RDW 14.8 12/27/2023 0826   LYMPHSABS 1.1 12/27/2023 0826   MONOABS 0.6 12/27/2023 0826   EOSABS 0.0 12/27/2023 0826   BASOSABS 0.0 12/27/2023 0826      Latest Ref Rng & Units 12/27/2023    8:26 AM 12/26/2023    4:48 PM 12/26/2023    3:35 AM  CMP  Glucose 70 - 99 mg/dL 827  97  896   BUN 8 - 23 mg/dL 41  40  39   Creatinine 0.44 - 1.00 mg/dL 7.48  7.67  7.71   Sodium 135 - 145 mmol/L 144  146  146   Potassium 3.5 - 5.1 mmol/L 3.9  4.4  4.1   Chloride 98 - 111 mmol/L 101  103  112   CO2 22 - 32 mmol/L 32  22  24   Calcium 8.9 - 10.3 mg/dL 9.0  9.1  8.7   Total Protein 6.5 - 8.1 g/dL 6.0     Total Bilirubin 0.0 - 1.2 mg/dL 1.2     Alkaline Phos 38 - 126 U/L 69     AST 15 - 41 U/L 20     ALT 0 - 44 U/L 41       Scheduled Meds:  apixaban  5 mg Oral BID   atorvastatin  40 mg Oral Daily   Chlorhexidine  Gluconate Cloth  6 each Topical Daily   insulin  aspart  0-9 Units Subcutaneous TID WC   mouth rinse  15 mL Mouth Rinse 4 times per day   Continuous Infusions:  Time 47  Veronica [name], MD  [facility]

## 2023-12-28 ENCOUNTER — Other Ambulatory Visit (HOSPITAL_COMMUNITY): Payer: Self-pay

## 2023-12-28 DIAGNOSIS — N1832 Chronic kidney disease, stage 3b: Secondary | ICD-10-CM | POA: Diagnosis not present

## 2023-12-28 DIAGNOSIS — I10 Essential (primary) hypertension: Secondary | ICD-10-CM

## 2023-12-28 DIAGNOSIS — E1169 Type 2 diabetes mellitus with other specified complication: Secondary | ICD-10-CM

## 2023-12-28 DIAGNOSIS — I480 Paroxysmal atrial fibrillation: Secondary | ICD-10-CM

## 2023-12-28 DIAGNOSIS — R0602 Shortness of breath: Secondary | ICD-10-CM

## 2023-12-28 DIAGNOSIS — E785 Hyperlipidemia, unspecified: Secondary | ICD-10-CM

## 2023-12-28 DIAGNOSIS — I5023 Acute on chronic systolic (congestive) heart failure: Secondary | ICD-10-CM

## 2023-12-28 DIAGNOSIS — E66811 Obesity, class 1: Secondary | ICD-10-CM

## 2023-12-28 LAB — BASIC METABOLIC PANEL WITH GFR
Anion gap: 12 (ref 5–15)
BUN: 42 mg/dL — ABNORMAL HIGH (ref 8–23)
CO2: 27 mmol/L (ref 22–32)
Calcium: 8.8 mg/dL — ABNORMAL LOW (ref 8.9–10.3)
Chloride: 102 mmol/L (ref 98–111)
Creatinine, Ser: 2.07 mg/dL — ABNORMAL HIGH (ref 0.44–1.00)
GFR, Estimated: 25 mL/min — ABNORMAL LOW (ref >60)
Glucose, Bld: 143 mg/dL — ABNORMAL HIGH (ref 70–99)
Potassium: 4 mmol/L (ref 3.5–5.1)
Sodium: 141 mmol/L (ref 135–145)

## 2023-12-28 LAB — GLUCOSE, CAPILLARY
Glucose-Capillary: 87 mg/dL (ref 70–99)
Glucose-Capillary: 87 mg/dL (ref 70–99)
Glucose-Capillary: 108 mg/dL — ABNORMAL HIGH (ref 70–99)

## 2023-12-28 MED ORDER — TORSEMIDE 20 MG PO TABS
20.0000 mg | ORAL_TABLET | Freq: Every day | ORAL | 0 refills | Status: AC
  Filled 2023-12-28: qty 30, 30d supply, fill #0

## 2023-12-28 MED ORDER — MIDODRINE HCL 5 MG PO TABS
5.0000 mg | ORAL_TABLET | Freq: Three times a day (TID) | ORAL | 0 refills | Status: AC
  Filled 2023-12-28: qty 90, 30d supply, fill #0

## 2023-12-28 MED ORDER — TORSEMIDE 20 MG PO TABS: 20.0000 mg | ORAL_TABLET | Freq: Every day | ORAL | Status: DC

## 2023-12-28 NOTE — Hospital Course (Addendum)
 Veronica Burke was admitted to the hospital with the working diagnosis of heart failure exacerbation.   68 year old female with the past medical history of heart failure, paroxysmal atrial fibrillation, T2DM, hypertension, OSA and obesity who presented with dyspnea. She had worsening dyspnea at home, refractive to increase dose of loop diuretic. EMS was called and she was found in respiratory distress with 02 saturation 86% on room air. She as placed on non re-breather and was transported to the ED. On her initial physical examination she continue to be in respiratory distress and was placed on Bipap.  Her blood pressure was 173/83, HR 63, RR 29 and 02 saturation 92%  Lungs with rales bilaterally with no wheezing, heart with S1 and S2 present and regular, abdomen with no distention and positive lower extremity edema.    Na 143, K 3,9 Cl 109 bicarbonate 21 glucose 117 bun 38 cr 2,34 BNP 2,406  High sensitive troponin 23 and 22 Lactic acid 2,1  Wbc 7,8 hgb 16,3 plt 206   Chest radiograph with cardiomegaly, bilateral hilar vascular congestion, bilateral central interstitial infiltrates. Pacemaker defibrillator in place with right atrial, left and right ventricular lead.  Stimulator in place right chest   EKG 74 bpm, right axis deviation, qtc 579, right bundle branch block, atrial sensed and ventricular paced with no significant ST segment or T wave changes.   Patient was placed on IV furosemide  and IV antibiotics.  Admitted to the ICU for non invasive mechanical ventilation.   Clinically improved, and achieved euvolemic state  06/25 patient will continue heart failure medical management and follow up as outpatient.

## 2023-12-28 NOTE — Plan of Care (Signed)
  Problem: Coping: Goal: Ability to adjust to condition or change in health will improve Outcome: Progressing   Problem: Activity: Goal: Risk for activity intolerance will decrease Outcome: Progressing   Problem: Pain Managment: Goal: General experience of comfort will improve and/or be controlled Outcome: Progressing   Problem: Safety: Goal: Ability to remain free from injury will improve Outcome: Progressing

## 2023-12-28 NOTE — Assessment & Plan Note (Signed)
 Plan to continue amiodarone  for rate and rhythm control.  Note ventricular paced rhythm  Continue anticoagulation with apixaban

## 2023-12-28 NOTE — Discharge Summary (Signed)
 Physician Discharge Summary   Patient: Veronica Burke MRN: [id number] DOB: 22-Feb-1956  Admit date:     12/25/2023  Discharge date: 12/28/23  Discharge Physician: Elidia Sieving Jakson Delpilar   PCP: Sharl Tully HERO, PA-C   Recommendations at discharge:    Discontinue metoprolol and digoxin Added midodrine for blood pressure support and increased torsemide  to 20 mg po daily. Limited guideline directed medical therapy due to the risk of hypotension or worsening renal function.  Follow up renal function and electrolytes as outpatient in 7 days Follow up with Kristy Rogerws PA -C in 7 to 10 days Follow up with Cardiology as scheduled.   Discharge Diagnoses: Principal Problem:   Acute respiratory failure (HCC)  Resolved Problems:   * No resolved hospital problems. [facility] Course: [name] was admitted to the hospital with the working diagnosis of heart failure exacerbation.   68 year old female with the past medical history of heart failure, paroxysmal atrial fibrillation, T2DM, hypertension, OSA and obesity who presented with dyspnea. She had worsening dyspnea at home, refractive to increase dose of loop diuretic. EMS was called and she was found in respiratory distress with 02 saturation 86% on room air. She as placed on non re-breather and was transported to the ED. On her initial physical examination she continue to be in respiratory distress and was placed on Bipap.  Her blood pressure was 173/83, HR 63, RR 29 and 02 saturation 92%  Lungs with rales bilaterally with no wheezing, heart with S1 and S2 present and regular, abdomen with no distention and positive lower extremity edema.    Na 143, K 3,9 Cl 109 bicarbonate 21 glucose 117 bun 38 cr 2,34 BNP 2,406  High sensitive troponin 23 and 22 Lactic acid 2,1  Wbc 7,8 hgb 16,3 plt 206   Chest radiograph with cardiomegaly, bilateral hilar vascular congestion, bilateral central interstitial infiltrates. Pacemaker defibrillator in place  with right atrial and right ventricular lead.  Stimulator in place right chest   EKG 74 bpm, right axis deviation, qtc 579, right bundle branch block, atrial sensed and ventricular paced with no significant ST segment or T wave changes.   Patient was placed on IV furosemide  and IV antibiotics.  Admitted to the ICU for non invasive mechanical ventilation.   Clinically improved, and achieved euvolemic state  06/25 patient will continue heart failure medical management and follow up as outpatient.   Assessment and Plan: * Acute on chronic systolic CHF (congestive heart failure) (HCC) Echocardiogram with reduced LV systolic function < 20%, global hypokinesis, severe dilatation LV cavity, RV systolic function low normal, normal size left and right atriums, severe mitral regurgitation, (sp mitral clip) moderate aortic regurgitation,   Patient was placed on IV furosemide  for diuresis, negative fluid balance was achieved, - 5,077 ml, with significant improvement in her symptoms.   Plan to continue blood pressure support with midodrine. Continue SGLT 2 inh and torsemide .  Follow up with cardiology as outpatient.   Acute hypoxemic respiratory failure due to acute cardiogenic pulmonary edema.  Required non invasive mechanical ventilation.  02 saturation at the time of discharge 96% on room air.  Follow up chest radiograph with resolution of infiltrates.  Respiratory failure now resolved.  Community acquired pneumonia has been ruled out.   Paroxysmal atrial fibrillation (HCC) Plan to continue amiodarone  for rate and rhythm control.  Note ventricular paced rhythm  Continue anticoagulation with apixaban   CKD stage 3b, GFR 30-44 ml/min (HCC) Renal function at the time of discharge with  serum cr at 2,0 with K at 4,0 and serum bicarbonate at 27  Na 141  Plan to continue diuresis with torsemide  and SGLT 2 inh Follow up renal function and electrolytes as outpatient.   Essential  hypertension Continue blood pressure support with midodrine, to avoid hypotension   Type 2 diabetes mellitus with hyperlipidemia (HCC) Patient was placed on insulin  sliding scale for glucose cover and monitoring At home resume dulaglutide 1.5 mg once a week Her glucose remained stable.  Continue statin therapy   Obesity, class 1 Calculated BMI 33      Consultants: cardiology  Procedures performed: none   Disposition: Home Diet recommendation:  Cardiac diet DISCHARGE MEDICATION: Allergies as of 12/28/2023       Reactions   Beef-derived Drug Products Other (See Comments)   Dislikes beef        Medication List     STOP taking these medications    digoxin 0.125 MG tablet Commonly known as: LANOXIN   metoprolol succinate 25 MG 24 hr tablet Commonly known as: TOPROL-XL       TAKE these medications    allopurinol 100 MG tablet Commonly known as: ZYLOPRIM Take 50 mg by mouth every other day.   amiodarone  200 MG tablet Commonly known as: PACERONE  Take 200 mg by mouth daily.   atorvastatin 40 MG tablet Commonly known as: LIPITOR Take 40 mg by mouth daily.   Eliquis 5 MG Tabs tablet Generic drug: apixaban Take 5 mg by mouth 2 (two) times daily.   empagliflozin 10 MG Tabs tablet Commonly known as: JARDIANCE Take 10 mg by mouth daily.   iron polysaccharides 150 MG capsule Commonly known as: NIFEREX Take 150 mg by mouth once a week. Monday   magnesium oxide 400 MG tablet Commonly known as: MAG-OX Take 400 mg by mouth 2 (two) times daily.   midodrine 5 MG tablet Commonly known as: PROAMATINE Take 1 tablet (5 mg total) by mouth 3 (three) times daily with meals. Start taking on: December 29, 2023   torsemide  20 MG tablet Commonly known as: DEMADEX  Take 1 tablet (20 mg total) by mouth daily. Start taking on: December 29, 2023 What changed:  medication strength how much to take when to take this   Trulicity 1.5 MG/0.5ML Soaj Generic drug:  Dulaglutide Inject 1.5 mg into the skin once a week. Thursday   Vitamin D3 Maximum Strength 125 MCG (5000 UT) capsule Generic drug: Cholecalciferol Take 5,000 Units by mouth daily.        Discharge Exam: Filed Weights   12/26/23 0701 12/27/23 0529 12/28/23 0432  Weight: 78 kg 76.1 kg 75.9 kg   BP (!) 128/95 (BP Location: Right Arm)   Pulse 61   Temp 98 F (36.7 C) (Oral)   Resp 18   Ht 4' 11.5 (1.511 m)   Wt 75.9 kg   SpO2 96%   BMI 33.22 kg/m   Patient is feeling better, has no chest pain and no dyspnea, no PND, orthopnea or lower extremity edema  Neurology awake and alert ENT with no pallor or icterus Cardiovascular with S1 and S2 present and regular with no gallops, rubs or murmurs No JVD No lower extremity edema Respiratory with no rales or wheezing, no rhonchi Abdomen with no distention   Condition at discharge: stable  The results of significant diagnostics from this hospitalization (including imaging, microbiology, ancillary and laboratory) are listed below for reference.   Imaging Studies: DG CHEST PORT 1 VIEW Result Date: 12/27/2023 CLINICAL  DATA:  History of pneumonia EXAM: PORTABLE CHEST 1 VIEW COMPARISON:  Chest x-ray 12/25/2023 FINDINGS: Cardiomegaly again noted. Left-sided ICD and right-sided generator are again noted. The lungs are clear. There is no pleural effusion or pneumothorax. No acute fractures are seen. IMPRESSION: 1. No active disease. 2. Cardiomegaly. Electronically Signed   By: Greig Pique M.D.   On: 12/27/2023 15:28   ECHOCARDIOGRAM COMPLETE Result Date: 12/25/2023    ECHOCARDIOGRAM REPORT   Patient Name:   Nona Gracey Date of Exam: 12/25/2023 Medical Rec #:  [id number]   Height:       59.5 in Accession #:    [id number]  Weight:       173.0 lb Date of Birth:  February 27, 1956  BSA:          1.744 m Patient Age:    67 years    BP:           134/84 mmHg Patient Gender: F           HR:           115 bpm. Exam Location:  Inpatient Procedure: 2D Echo,  Cardiac Doppler and Color Doppler (Both Spectral and Color            Flow Doppler were utilized during procedure). Indications:    CHF - Acute Systolic I50.21  History:        Patient has no prior history of Echocardiogram examinations.                 CHF, CKD, stage 3; Risk Factors:Diabetes.  Sonographer:    Thea Norlander RCS Referring Phys: BENTON BIRCH HARRIS IMPRESSIONS  1. Left ventricular ejection fraction, by estimation, is <20%. The left ventricle has severely decreased function. The left ventricle demonstrates global hypokinesis. The left ventricular internal cavity size was severely dilated. Left ventricular diastolic function could not be evaluated due to mitral annular calcification (moderate or greater) and possible repair. But findings suggestive of Grade III diastolic dysfunction (restrictive).. Elevated left atrial pressure. The E/e' is 29.  2. Right ventricular systolic function is low normal. The right ventricular size is normal.  3. Left atrial size was grossly normal.  4. Right atrial size was grossly normal.  5. Mitral valve degenerative and possibly repaired (s/p Mitral Clip at A2/P3 scallop best seen on apical views), severe MR (jet directed central and eccentric to the lateral wall), no valvular stenosis. . Severe mitral annular calcification.  6. The aortic valve is tricuspid. Aortic valve regurgitation is moderate. No aortic stenosis is present.  7. The inferior vena cava is normal in size with greater than 50% respiratory variability, suggesting right atrial pressure of 3 mmHg. Comparison(s): No prior Echocardiogram. FINDINGS  Left Ventricle: Left ventricular ejection fraction, by estimation, is <20%. The left ventricle has severely decreased function. The left ventricle demonstrates global hypokinesis. The left ventricular internal cavity size was severely dilated. There is no left ventricular hypertrophy. Left ventricular diastolic function could not be evaluated due to mitral annular  calcification (moderate or greater) and possible repair. But findings suggestive of Grade III diastolic dysfunction (restrictive). Elevated left atrial pressure. The E/e' is 17. Right Ventricle: The right ventricular size is normal. Right vetricular wall thickness was not well visualized. Right ventricular systolic function is low normal. Left Atrium: Left atrial size was grossly normal. Right Atrium: Right atrial size was grossly normal. Pericardium: There is no evidence of pericardial effusion. Mitral Valve: Mitral valve degenerative and possibly repaired (s/p Mitral Clip at  A2/P3 scallop best seen on apical views), severe MR (jet directed central and eccentric to the lateral wall), no valvular stenosis. Severe mitral annular calcification. Tricuspid Valve: The tricuspid valve is grossly normal. Tricuspid valve regurgitation is trivial. No evidence of tricuspid stenosis. Aortic Valve: The aortic valve is tricuspid. Aortic valve regurgitation is moderate. No aortic stenosis is present. Aortic valve peak gradient measures 8.2 mmHg. Pulmonic Valve: The pulmonic valve was not well visualized. Aorta: The aortic root and ascending aorta are structurally normal, with no evidence of dilitation. Venous: The inferior vena cava is normal in size with greater than 50% respiratory variability, suggesting right atrial pressure of 3 mmHg. IAS/Shunts: There is right bowing of the interatrial septum, suggestive of elevated left atrial pressure. The atrial septum is grossly normal. Additional Comments: A device lead is visualized.  LEFT VENTRICLE PLAX 2D LVIDd:         6.40 cm      Diastology LVIDs:         6.00 cm      LV e' medial:    6.64 cm/s LV PW:         0.80 cm      LV E/e' medial:  23.9 LV IVS:        0.70 cm      LV e' lateral:   4.79 cm/s LVOT diam:     2.10 cm      LV E/e' lateral: 33.2 LV SV:         52 LV SV Index:   30 LVOT Area:     3.46 cm  LV Volumes (MOD) LV vol d, MOD A2C: 171.0 ml LV vol d, MOD A4C: 181.0 ml  LV vol s, MOD A2C: 144.0 ml LV vol s, MOD A4C: 159.0 ml LV SV MOD A2C:     27.0 ml LV SV MOD A4C:     181.0 ml LV SV MOD BP:      26.9 ml RIGHT VENTRICLE         IVC TAPSE (M-mode): 2.0 cm  IVC diam: 1.80 cm LEFT ATRIUM         Index LA diam:    4.00 cm 2.29 cm/m  AORTIC VALVE AV Area (Vmax): 2.07 cm AV Vmax:        143.00 cm/s AV Peak Grad:   8.2 mmHg LVOT Vmax:      85.60 cm/s LVOT Vmean:     56.000 cm/s LVOT VTI:       0.151 m  AORTA Ao Root diam: 3.10 cm Ao Asc diam:  3.60 cm MITRAL VALVE MV Area (PHT): 4.57 cm     SHUNTS MV Decel Time: 166 msec     Systemic VTI:  0.15 m MV E velocity: 159.00 cm/s  Systemic Diam: 2.10 cm MV A velocity: 46.40 cm/s MV E/A ratio:  3.43 Sunit Tolia Electronically signed by Madonna Large Signature Date/Time: 12/25/2023/1:38:43 PM    Final    DG Chest Portable 1 View Result Date: 12/25/2023 CLINICAL DATA:  Shortness of breath chest rate EXAM: PORTABLE CHEST 1 VIEW COMPARISON:  Graft dated 10/26/2023 FINDINGS: Lines/tubes: Left chest wall ICD leads project over the right atrium and ventricle and tributary of the coronary sinus. Nerve stimulator projects over the right chest. Lead terminates above the field of view. Lungs: Low lung volumes with bronchovascular crowding. Diffuse bilateral patchy and interstitial opacities. Pleura: The left costophrenic angle is obscured. No definite pneumothorax or pleural effusion. Heart/mediastinum: Similar enlarged cardiomediastinal silhouette. Bones:  No acute osseous abnormality. IMPRESSION: 1. Diffuse bilateral patchy and interstitial opacities, which may represent pulmonary edema or multifocal infection. 2. Similar cardiomegaly. Electronically Signed   By: Limin  Xu M.D.   On: 12/25/2023 08:21    Microbiology: Results for orders placed or performed during the hospital encounter of 12/25/23  MRSA Next Gen by PCR, Nasal     Status: None   Collection Time: 12/25/23 10:20 AM   Specimen: Nasal Mucosa; Nasal Swab  Result Value Ref Range Status    MRSA by PCR Next Gen NOT DETECTED NOT DETECTED Final    Comment: (NOTE) The GeneXpert MRSA Assay (FDA approved for NASAL specimens only), is one component of a comprehensive MRSA colonization surveillance program. It is not intended to diagnose MRSA infection nor to guide or monitor treatment for MRSA infections. Test performance is not FDA approved in patients less than 62 years old. Performed at [facility], [address] [place], [place] [address]     Labs: CBC: Recent Labs  Lab 12/25/23 0755 12/25/23 1020 12/25/23 1102 12/26/23 0335 12/27/23 0826  WBC 7.8 14.0*  --  17.2* 9.7  NEUTROABS 5.3  --   --   --  8.0*  HGB 16.3* 16.5* 18.0* 15.7* 15.0  HCT 53.1* 53.6* 53.0* 52.1* 48.3*  MCV 91.6 92.6  --  92.0 91.1  PLT 206 161  --  135* 159   Basic Metabolic Panel: Recent Labs  Lab 12/25/23 0755 12/25/23 1020 12/25/23 1102 12/26/23 0335 12/26/23 1648 12/27/23 0826 12/28/23 0830  NA 143  --  144 146* 146* 144 141  K 3.9  --  4.2 4.1 4.4 3.9 4.0  CL 109  --   --  112* 103 101 102  CO2 21*  --   --  24 22 32 27  GLUCOSE 117*  --   --  103* 97 172* 143*  BUN 38*  --   --  39* 40* 41* 42*  CREATININE 2.34* 2.43*  --  2.28* 2.32* 2.51* 2.07*  CALCIUM 9.3  --   --  8.7* 9.1 9.0 8.8*  MG  --   --   --  2.2  --   --   --   PHOS  --   --   --  5.2*  --   --   --    Liver Function Tests: Recent Labs  Lab 12/27/23 0826  AST 20  ALT 41  ALKPHOS 69  BILITOT 1.2  PROT 6.0*  ALBUMIN 2.8*   CBG: Recent Labs  Lab 12/27/23 1100 12/27/23 1521 12/27/23 2103 12/28/23 0558 12/28/23 1105  GLUCAP 87 98 138* 87 87    Discharge time spent: greater than 30 minutes.  Signed: Elidia Toribio Furnace, MD [facility] 12/28/2023

## 2023-12-28 NOTE — Assessment & Plan Note (Signed)
 Echocardiogram with reduced LV systolic function < 20%, global hypokinesis, severe dilatation LV cavity, RV systolic function low normal, normal size left and right atriums, severe mitral regurgitation, (sp mitral clip) moderate aortic regurgitation,   Patient was placed on IV furosemide  for diuresis, negative fluid balance was achieved, - 5,077 ml, with significant improvement in her symptoms.   Plan to continue blood pressure support with midodrine. Continue SGLT 2 inh and torsemide .  Follow up with cardiology as outpatient.   Acute hypoxemic respiratory failure due to acute cardiogenic pulmonary edema.  Required non invasive mechanical ventilation.  02 saturation at the time of discharge 96% on room air.  Follow up chest radiograph with resolution of infiltrates.  Respiratory failure now resolved.  Community acquired pneumonia has been ruled out.

## 2023-12-28 NOTE — Assessment & Plan Note (Signed)
 Calculated BMI 33.

## 2023-12-28 NOTE — Progress Notes (Signed)
 See consult note yesterday No new recs Please call if ?s  Oneil Parchment, MD

## 2023-12-28 NOTE — Assessment & Plan Note (Signed)
 Continue blood pressure support with midodrine, to avoid hypotension

## 2023-12-28 NOTE — Assessment & Plan Note (Signed)
 Renal function at the time of discharge with serum cr at 2,0 with K at 4,0 and serum bicarbonate at 27  Na 141  Plan to continue diuresis with torsemide  and SGLT 2 inh Follow up renal function and electrolytes as outpatient.

## 2023-12-28 NOTE — Progress Notes (Signed)
 Mobility Specialist Progress Note:   12/28/23 1340  Mobility  Activity Ambulated with assistance in hallway  Level of Assistance Contact guard assist, steadying assist  Assistive Device None  Distance Ambulated (ft) 200 ft  Activity Response Tolerated well  Mobility Referral Yes  Mobility visit 1 Mobility  Mobility Specialist Start Time (ACUTE ONLY) 1340  Mobility Specialist Stop Time (ACUTE ONLY) 1351  Mobility Specialist Time Calculation (min) (ACUTE ONLY) 11 min   Pt agreeable to session. Met pt coming out of bathroom. No c/o any symptoms. Pt was left in recliner w/ all needs met  Pre Mobility: EOB SpO2 97% Post Mobility:Recliner SpO2 95%  Therisa Rana Mobility Specialist Please contact via Special educational needs teacher or  Rehab office at [phone]

## 2023-12-28 NOTE — TOC Transition Note (Signed)
 Transition of Care [facility]) - Discharge Note   Patient Details  Name: Veronica Burke MRN: [id number] Date of Birth: 07/09/1955  [facility] CM/SW Contact:  Waddell Barnie Rama, RN Phone Number: 12/28/2023, 4:16 PM   Clinical Narrative:    For dc today, she states her daughter , Nolene will transport her home.  She will continue with outpatient Physical therapy at [facility] on [address]      Barriers to Discharge: Continued Medical Work up   Patient Goals and CMS Choice Patient states their goals for this hospitalization and ongoing recovery are:: Return home          Discharge Placement                       Discharge Plan and Services Additional resources added to the After Visit Summary for                                       Social Drivers of Health (SDOH) Interventions SDOH Screenings   Food Insecurity: No Food Insecurity (12/25/2023)  Housing: Low Risk  (12/25/2023)  Transportation Needs: No Transportation Needs (12/25/2023)  Utilities: Not At Risk (12/25/2023)  Financial Resource Strain: Medium Risk (10/03/2022)   Received from [facility]  Social Connections: Moderately Isolated (12/25/2023)  Tobacco Use: Low Risk  (12/25/2023)     Readmission Risk Interventions     No data to display

## 2023-12-28 NOTE — Assessment & Plan Note (Addendum)
 Patient was placed on insulin  sliding scale for glucose cover and monitoring At home resume dulaglutide 1.5 mg once a week Her glucose remained stable.  Continue statin therapy

## 2023-12-28 NOTE — Progress Notes (Signed)
 Discharge instructions (including medications) discussed with and copy provided to patient and she verbalized understanding. PIV removed with NT+3 and patient dressed herself. Awaiting TOC medications and daughter to pick her up in 10 minutes.
